# Patient Record
Sex: Female | Born: 1995 | Race: White | Hispanic: No | Marital: Single | Smoking: Never smoker
Health system: Southern US, Community
[De-identification: ages and names within clinical notes are randomized; demographics above are authoritative.]

## PROBLEM LIST (undated history)

## (undated) DIAGNOSIS — Z789 Other specified health status: Secondary | ICD-10-CM

## (undated) HISTORY — DX: Other specified health status: Z78.9

## (undated) HISTORY — PX: WISDOM TOOTH EXTRACTION: SHX21

---

## 2019-02-07 HISTORY — PX: KNEE SURGERY: SHX244

## 2019-06-09 HISTORY — PX: KNEE SURGERY: SHX244

## 2021-07-05 LAB — OB RESULTS CONSOLE HIV ANTIBODY (ROUTINE TESTING): HIV: NONREACTIVE

## 2021-07-05 LAB — OB RESULTS CONSOLE HGB/HCT, BLOOD
HCT: 38 (ref 29–41)
Hemoglobin: 12.5

## 2021-07-05 LAB — OB RESULTS CONSOLE PLATELET COUNT: Platelets: 276

## 2021-07-05 LAB — OB RESULTS CONSOLE RPR: RPR: NONREACTIVE

## 2021-07-05 LAB — OB RESULTS CONSOLE GC/CHLAMYDIA
Chlamydia: NEGATIVE
Gonorrhea: NEGATIVE

## 2021-07-05 LAB — OB RESULTS CONSOLE ABO/RH: RH Type: NEGATIVE

## 2021-07-05 LAB — OB RESULTS CONSOLE ANTIBODY SCREEN: Antibody Screen: NEGATIVE

## 2021-07-05 LAB — OB RESULTS CONSOLE VARICELLA ZOSTER ANTIBODY, IGG: Varicella: IMMUNE

## 2021-07-05 LAB — OB RESULTS CONSOLE RUBELLA ANTIBODY, IGM: Rubella: IMMUNE

## 2021-07-05 LAB — OB RESULTS CONSOLE HEPATITIS B SURFACE ANTIGEN: Hepatitis B Surface Ag: NEGATIVE

## 2021-07-05 LAB — HEPATITIS C ANTIBODY: HCV Ab: NEGATIVE

## 2021-08-09 LAB — CYSTIC FIBROSIS DIAGNOSTIC STUDY: Interpretation-CFDNA:: POSITIVE

## 2021-09-08 NOTE — L&D Delivery Note (Signed)
OB/GYN Faculty Practice Delivery Note  Chloe Chen is a 26 y.o. G1P1001 s/p SVD at [redacted]w[redacted]d. She was admitted for IOL for previous FGR.   ROM: 9h 60m with clear fluid GBS Status: negative Maximum Maternal Temperature: 98.1  Labor Progress: Presented for IOL and received cytotec x2, and then was started on pitocin, SROMed and then progressed to complete  Delivery Date/Time: 1113 on 5/23 Delivery: Called to room and patient was complete and pushing. Head delivered OA. No nuchal cord present. Shoulder and body delivered in usual fashion. Infant with spontaneous cry, placed on mother's abdomen, dried and stimulated. Cord clamped x 2 after 1-minute delay, and cut by father of baby Chloe Chen. Cord blood drawn. Placenta delivered spontaneously with gentle cord traction. Fundus firm with massage and Pitocin. Labia, perineum, vagina, and cervix inspected and found to have bilateral labial lacerations. The right labial laceration was deeper was repaired with 3-0 Vicryl. The left labial laceration was repaired with 4-0 monocryl in running fashion   Placenta: intact, 3V cord, to L&D Complications: none Lacerations: bilateral labial EBL: 350cc Analgesia: epidural  Infant: female  APGARs 9,9  3050g  Chloe Mccreedy, MD, MPH OB Fellow, Faculty Practice Center for [employer], [facility]

## 2021-10-17 ENCOUNTER — Other Ambulatory Visit: Payer: Self-pay

## 2021-10-17 ENCOUNTER — Ambulatory Visit (INDEPENDENT_AMBULATORY_CARE_PROVIDER_SITE_OTHER): Payer: PRIVATE HEALTH INSURANCE

## 2021-10-17 DIAGNOSIS — Z3400 Encounter for supervision of normal first pregnancy, unspecified trimester: Secondary | ICD-10-CM

## 2021-10-17 DIAGNOSIS — O26899 Other specified pregnancy related conditions, unspecified trimester: Secondary | ICD-10-CM | POA: Insufficient documentation

## 2021-10-17 DIAGNOSIS — O0990 Supervision of high risk pregnancy, unspecified, unspecified trimester: Secondary | ICD-10-CM | POA: Insufficient documentation

## 2021-10-17 DIAGNOSIS — Z141 Cystic fibrosis carrier: Secondary | ICD-10-CM | POA: Insufficient documentation

## 2021-10-17 DIAGNOSIS — Z6791 Unspecified blood type, Rh negative: Secondary | ICD-10-CM | POA: Insufficient documentation

## 2021-10-17 MED ORDER — BLOOD PRESSURE KIT DEVI: 1.0000 | Freq: Once | 0 refills | Status: AC

## 2021-10-17 NOTE — Progress Notes (Signed)
New OB Intake  Chloe Chen is here today in person to transfer care from [facility], [place]. I explained I am completing New OB Intake today. We discussed her EDD of 02/06/22 that is based on LMP of 05/02/21. Pt is G1/P0. I reviewed her allergies, medications, Medical/Surgical/OB history, and appropriate screenings. Based on history, this is a low risk pregnancy.  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   MyChart/Babyscripts MyChart access verified. Babyscripts instructions given and order placed.  Blood Pressure Cuff  Blood pressure cuff ordered for patient to pick-up from [employer]. Explained after first prenatal appt pt will check weekly and document in Babyscripts.  Anatomy US Explained first scheduled [place] will be around 19 weeks. Anatomy US scheduled for 11/07/21 at 0800.  Labs Labs completed at former OB/GYN. Last PAP completed 2020; this was normal per patient, but no record. Will need PAP postpartum. Carrier screen positive for Cystic Fibrosis. Partner declined testing prior, requested to wait until care was transferred. [employer] test arranged for partner.  COVID Vaccine Patient has had COVID vaccine.   CenteringPregnancy Candidate?  No- GA [redacted] weeks  Mom + Baby Combined Care Candidate?    Yes- patient enrolled. Stephanie RN to bedside to schedule new OB appts.  Social Determinants of Health Food Insecurity: Patient denies food insecurity. Transportation: Patient denies transportation needs. Childcare: Discussed no children allowed at ultrasound appointments. Offered childcare services; patient declines childcare services at this time.  First visit review I reviewed new OB appt with pt. I explained she will have a provider visit that  will include physical exam and possible labs. Explained pt will be seen by Mom+Baby provider at first visit; encounter routed to appropriate provider. Explained that patient will be seen by pregnancy navigator following visit with provider.    Marjo Bicker, RN 10/17/2021  4:52 PM

## 2021-10-22 NOTE — Progress Notes (Signed)
Attestation of Attending Supervision of clinical support staff: I agree with the care provided to this patient and was available for any consultation.  I have reviewed the RN's note and chart. I was available for consult and to see the patient if needed.   Isamar Nazir MD MPH Attending Physician Faculty Practice- [facility]  

## 2021-10-29 ENCOUNTER — Telehealth: Payer: Self-pay | Admitting: Family Medicine

## 2021-10-29 NOTE — Telephone Encounter (Signed)
Call placed back to pt. Spoke with pt. Pt needing to reschedule new OB appt due to work schedule. Pt rescheduled for 3/3 at 935am. Pt verbalized understanding and agreeable to new date and time of appt.  Laney Pastor

## 2021-10-29 NOTE — Telephone Encounter (Signed)
This patient is needing to change for appt for [facility] on 11/05/21 at 3:15pm.

## 2021-11-05 ENCOUNTER — Encounter: Payer: PRIVATE HEALTH INSURANCE | Admitting: Nurse Practitioner

## 2021-11-07 ENCOUNTER — Encounter: Payer: Self-pay | Admitting: *Deleted

## 2021-11-07 ENCOUNTER — Other Ambulatory Visit: Payer: Self-pay

## 2021-11-07 ENCOUNTER — Other Ambulatory Visit: Payer: Self-pay | Admitting: *Deleted

## 2021-11-07 ENCOUNTER — Ambulatory Visit: Payer: Medicaid Other | Attending: Family Medicine

## 2021-11-07 ENCOUNTER — Ambulatory Visit: Payer: Medicaid Other | Admitting: *Deleted

## 2021-11-07 VITALS — BP 112/69 | HR 64

## 2021-11-07 DIAGNOSIS — Z6791 Unspecified blood type, Rh negative: Secondary | ICD-10-CM | POA: Insufficient documentation

## 2021-11-07 DIAGNOSIS — Z363 Encounter for antenatal screening for malformations: Secondary | ICD-10-CM | POA: Insufficient documentation

## 2021-11-07 DIAGNOSIS — Z3400 Encounter for supervision of normal first pregnancy, unspecified trimester: Secondary | ICD-10-CM

## 2021-11-07 DIAGNOSIS — Z148 Genetic carrier of other disease: Secondary | ICD-10-CM | POA: Diagnosis not present

## 2021-11-07 DIAGNOSIS — Z362 Encounter for other antenatal screening follow-up: Secondary | ICD-10-CM

## 2021-11-07 DIAGNOSIS — Z3A27 27 weeks gestation of pregnancy: Secondary | ICD-10-CM | POA: Insufficient documentation

## 2021-11-07 DIAGNOSIS — O09892 Supervision of other high risk pregnancies, second trimester: Secondary | ICD-10-CM

## 2021-11-07 DIAGNOSIS — O26899 Other specified pregnancy related conditions, unspecified trimester: Secondary | ICD-10-CM

## 2021-11-07 DIAGNOSIS — Z141 Cystic fibrosis carrier: Secondary | ICD-10-CM | POA: Diagnosis not present

## 2021-11-08 ENCOUNTER — Other Ambulatory Visit: Payer: Self-pay | Admitting: General Practice

## 2021-11-08 ENCOUNTER — Ambulatory Visit (INDEPENDENT_AMBULATORY_CARE_PROVIDER_SITE_OTHER): Payer: Medicaid Other | Admitting: Family Medicine

## 2021-11-08 ENCOUNTER — Other Ambulatory Visit: Payer: PRIVATE HEALTH INSURANCE

## 2021-11-08 VITALS — BP 122/63 | HR 70 | Wt 168.6 lb

## 2021-11-08 DIAGNOSIS — Z6791 Unspecified blood type, Rh negative: Secondary | ICD-10-CM

## 2021-11-08 DIAGNOSIS — Z23 Encounter for immunization: Secondary | ICD-10-CM | POA: Diagnosis not present

## 2021-11-08 DIAGNOSIS — Z3403 Encounter for supervision of normal first pregnancy, third trimester: Secondary | ICD-10-CM

## 2021-11-08 DIAGNOSIS — O26899 Other specified pregnancy related conditions, unspecified trimester: Secondary | ICD-10-CM

## 2021-11-08 DIAGNOSIS — Z3042 Encounter for surveillance of injectable contraceptive: Secondary | ICD-10-CM

## 2021-11-08 MED ORDER — RHO D IMMUNE GLOBULIN 1500 UNIT/2ML IJ SOSY
300.0000 ug | PREFILLED_SYRINGE | Freq: Once | INTRAMUSCULAR | Status: AC
  Administered 2021-11-08: 300 ug via INTRAMUSCULAR

## 2021-11-08 NOTE — Progress Notes (Signed)
?  ? ?Subjective:  ? ?Chloe Chen is a 26 y.o. G1P0000 at [redacted]w[redacted]d by LMP, first trimester ultrasound being seen today for her first obstetrical visit.  Her obstetrical history is significant for  rh neg . Patient does intend to breast feed. Pregnancy history fully reviewed. ? ?Patient reports no complaints. ? ?HISTORY: ?OB History  ?Gravida Para Term Preterm AB Living  ?1 0 0 0 0 0  ?SAB IAB Ectopic Multiple Live Births  ?0 0 0 0 0  ?  ?# Outcome Date GA Lbr Len/2nd Weight Sex Delivery Anes PTL Lv  ?1 Current           ?  ? ?Last pap smear: ?No results found for: DIAGPAP, HPV, HPVHIGH ?*need records* ? ?Past Medical History:  ?Diagnosis Date  ? Medical history non-contributory   ? ?Past Surgical History:  ?Procedure Laterality Date  ? KNEE SURGERY  02/2019  ? KNEE SURGERY  06/2019  ? WISDOM TOOTH EXTRACTION    ? ?Family History  ?Problem Relation Age of Onset  ? Diabetes Neg Hx   ? Hypertension Neg Hx   ? Cancer Neg Hx   ? ?Social History  ? ?Tobacco Use  ? Smoking status: Never  ? Smokeless tobacco: Never  ?Vaping Use  ? Vaping Use: Never used  ?Substance Use Topics  ? Alcohol use: Never  ? Drug use: Never  ? ?No Known Allergies ?Current Outpatient Medications on File Prior to Visit  ?Medication Sig Dispense Refill  ? doxylamine, Sleep, (UNISOM) 25 MG tablet Take 25 mg by mouth at bedtime as needed.    ? Prenatal Vit-Fe Fumarate-FA (MULTIVITAMIN-PRENATAL) 27-0.8 MG TABS tablet Take 1 tablet by mouth daily at 12 noon.    ? Pyridoxine HCl (B-6 PO) Take by mouth.    ? ?No current facility-administered medications on file prior to visit.  ? ? ? ?Exam  ? ?Vitals:  ? 11/08/21 0953  ?BP: 122/63  ?Pulse: 70  ?Weight: 168 lb 9.6 oz (76.5 kg)  ? ?Fetal Heart Rate (bpm): 145 ? ?System: General: well-developed, well-nourished female in no acute distress  ? Skin: normal coloration and turgor, no rashes  ? Neurologic: oriented, normal, negative, normal mood  ? Extremities: normal strength, tone, and muscle mass, ROM of all  joints is normal  ? HEENT PERRLA, extraocular movement intact and sclera clear, anicteric  ? Neck supple and no masses  ? Respiratory:  no respiratory distress  ? ? ?  ?Assessment:  ? ?Pregnancy: G1P0000 ?Patient Active Problem List  ? Diagnosis Date Noted  ? Supervision of low-risk first pregnancy 10/17/2021  ? Rh negative state in antepartum period 10/17/2021  ? 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 ?  ?Plan:  ?1. Rh negative state in antepartum period ?Rhogam given today ? ?2. Encounter for supervision of low-risk first pregnancy in third trimester ?Initial labs drawn. ?Needs pp pap ?Continue prenatal vitamins. ?Genetic Screening discussed, NIPS: results reviewed ?Ultrasound discussed; fetal anatomic survey: results reviewed. 12%, reassured patient, will see what follow up growth US shows.  ?Problem list reviewed and updated. ?The nature of [facility] clinic was explained to patient; Voiced they may need to be seen by other [facility] providers which includes family medicine physicians, OB GYNs, and APPs. Delivery will hopefully be with one of the [facility] providers or another [facility] Medicine physician and we cannot promise this at this time.  Discussed there are [facility] staff in the hospital 24-7 and they understand and support this model and there  is a likelihood one of these providers will catch their baby.  We also discussed that the service includes learners (residents, student) and they will be involved in the care team.  ?- [facility] AMB BABYSCRIPTS SCHEDULE OPTIMIZATION ? ?3. Cystic fibrosis carrier ?Partner testing in process ? ?Routine obstetric precautions reviewed. ?Return in 2 weeks (on 11/22/2021) for [facility] patient, ob visit. ? ?  ? ?

## 2021-11-08 NOTE — Patient Instructions (Signed)
Third Trimester of Pregnancy The third trimester of pregnancy is from week 28 through week 32. This is months 7 through 9. The third trimester is a time when the unborn baby (fetus) is growing rapidly. At the end of the ninth month, the fetus is about 20 inches long and weighs 6-10 pounds. Body changes during your third trimester During the third trimester, your body will continue to go through many changes. The changes vary and generally return to normal after your baby is born. Physical changes Your weight will continue to increase. You can expect to gain 25-35 pounds (11-16 kg) by the end of the pregnancy if you begin pregnancy at a normal weight. If you are underweight, you can expect to gain 28-40 lb (about 13-18 kg), and if you are overweight, you can expect to gain 15-25 lb (about 7-11 kg). You may begin to get stretch marks on your hips, abdomen, and breasts. Your breasts will continue to grow and may hurt. A yellow fluid (colostrum) may leak from your breasts. This is the first milk you are producing for your baby. You may have changes in your hair. These can include thickening of your hair, rapid growth, and changes in texture. Some people also have hair loss during or after pregnancy, or hair that feels dry or thin. Your belly button may stick out. You may notice more swelling in your hands, face, or ankles. Health changes You may have heartburn. You may have constipation. You may develop hemorrhoids. You may develop swollen, bulging veins (varicose veins) in your legs. You may have increased body aches in the pelvis, back, or thighs. This is due to weight gain and increased hormones that are relaxing your joints. You may have increased tingling or numbness in your hands, arms, and legs. The skin on your abdomen may also feel numb. You may feel short of breath because of your expanding uterus. Other changes You may urinate more often because the fetus is moving lower into your pelvis  and pressing on your bladder. You may have more problems sleeping. This may be caused by the size of your abdomen, an increased need to urinate, and an increase in your body's metabolism. You may notice the fetus "dropping," or moving lower in your abdomen (lightening). You may have increased vaginal discharge. You may notice that you have pain around your pelvic bone as your uterus distends. Follow these instructions at home: Medicines Follow your health care provider's instructions regarding medicine use. Specific medicines may be either safe or unsafe to take during pregnancy. Do not take any medicines unless approved by your health care provider. Take a prenatal vitamin that contains at least 600 micrograms (mcg) of folic acid. Eating and drinking Eat a healthy diet that includes fresh fruits and vegetables, whole grains, good sources of protein such as meat, eggs, or tofu, and low-fat dairy products. Avoid raw meat and unpasteurized juice, milk, and cheese. These carry germs that can harm you and your baby. Eat 4 or 5 small meals rather than 3 large meals a day. You may need to take these actions to prevent or treat constipation: Drink enough fluid to keep your urine pale yellow. Eat foods that are high in fiber, such as beans, whole grains, and fresh fruits and vegetables. Limit foods that are high in fat and processed sugars, such as fried or sweet foods. Activity Exercise only as directed by your health care provider. Most people can continue their usual exercise routine during pregnancy. Try to  exercise for 30 minutes at least 5 days a week. Stop exercising if you experience contractions in the uterus. °Stop exercising if you develop pain or cramping in the lower abdomen or lower back. °Avoid heavy lifting. °Do not exercise if it is very hot or humid or if you are at a high altitude. °If you choose to, you may continue to have sex unless your health care provider tells you not  to. °Relieving pain and discomfort °Take frequent breaks and rest with your legs raised (elevated) if you have leg cramps or low back pain. °Take warm sitz baths to soothe any pain or discomfort caused by hemorrhoids. Use hemorrhoid cream if your health care provider approves. °Wear a supportive bra to prevent discomfort from breast tenderness. °If you develop varicose veins: °Wear support hose as told by your health care provider. °Elevate your feet for 15 minutes, 3-4 times a day. °Limit salt in your diet. °Safety °Talk to your health care provider before traveling far distances. °Do not use hot tubs, steam rooms, or saunas. °Wear your seat belt at all times when driving or riding in a car. °Talk with your health care provider if someone is verbally or physically abusive to you. °Preparing for birth °To prepare for the arrival of your baby: °Take prenatal classes to understand, practice, and ask questions about labor and delivery. °Visit the hospital and tour the maternity area. °Purchase a rear-facing car seat and make sure you know how to install it in your car. °Prepare the baby's room or sleeping area. Make sure to remove all pillows and stuffed animals from the baby's crib to prevent suffocation. °General instructions °Avoid cat litter boxes and soil used by cats. These carry germs that can cause birth defects in the baby. If you have a cat, ask someone to clean the litter box for you. °Do not douche or use tampons. Do not use scented sanitary pads. °Do not use any products that contain nicotine or tobacco, such as cigarettes, e-cigarettes, and chewing tobacco. If you need help quitting, ask your health care provider. °Do not use any herbal remedies, illegal drugs, or medicines that were not prescribed to you. Chemicals in these products can harm your baby. °Do not drink alcohol. °You will have more frequent prenatal exams during the third trimester. During a routine prenatal visit, your health care provider  will do a physical exam, perform tests, and discuss your overall health. Keep all follow-up visits. This is important. °Where to find more information °American Pregnancy Association: americanpregnancy.org °American College of Obstetricians and Gynecologists: acog.org/en/Womens%20Health/Pregnancy °Office on Women's Health: womenshealth.gov/pregnancy °Contact a health care provider if you have: °A fever. °Mild pelvic cramps, pelvic pressure, or nagging pain in your abdominal area or lower back. °Vomiting or diarrhea. °Bad-smelling vaginal discharge or foul-smelling urine. °Pain when you urinate. °A headache that does not go away when you take medicine. °Visual changes or see spots in front of your eyes. °Get help right away if: °Your water breaks. °You have regular contractions less than 5 minutes apart. °You have spotting or bleeding from your vagina. °You have severe abdominal pain. °You have difficulty breathing. °You have chest pain. °You have fainting spells. °You have not felt your baby move for the time period told by your health care provider. °You have new or increased pain, swelling, or redness in an arm or leg. °Summary °The third trimester of pregnancy is from week 28 through week 40 (months 7 through 9). °You may have more problems sleeping.   This can be caused by the size of your abdomen, an increased need to urinate, and an increase in your body's metabolism. You will have more frequent prenatal exams during the third trimester. Keep all follow-up visits. This is important. This information is not intended to replace advice given to you by your health care provider. Make sure you discuss any questions you have with your health care provider. Document Revised: 02/01/2020 Document Reviewed: 12/08/2019 Elsevier Patient Education  2022 Else[employer] Inc.  Contraception Choices Contraception, also called birth control, refers to methods or devices that prevent pregnancy. Hormonal methods Contraceptive  implant A contraceptive implant is a thin, plastic tube that contains a hormone that prevents pregnancy. It is different from an intrauterine device (IUD). It is inserted into the upper part of the arm by a health care provider. Implants can be effective for up to 3 years. Progestin-only injections Progestin-only injections are injections of progestin, a synthetic form of the hormone progesterone. They are given every 3 months by a health care provider. Birth control pills Birth control pills are pills that contain hormones that prevent pregnancy. They must be taken once a day, preferably at the same time each day. A prescription is needed to use this method of contraception. Birth control patch The birth control patch contains hormones that prevent pregnancy. It is placed on the skin and must be changed once a week for three weeks and removed on the fourth week. A prescription is needed to use this method of contraception. Vaginal ring A vaginal ring contains hormones that prevent pregnancy. It is placed in the vagina for three weeks and removed on the fourth week. After that, the process is repeated with a new ring. A prescription is needed to use this method of contraception. Emergency contraceptive Emergency contraceptives prevent pregnancy after unprotected sex. They come in pill form and can be taken up to 5 days after sex. They work best the sooner they are taken after having sex. Most emergency contraceptives are available without a prescription. This method should not be used as your only form of birth control. Barrier methods Female condom A female condom is a thin sheath that is worn over the penis during sex. Condoms keep sperm from going inside a woman's body. They can be used with a sperm-killing substance (spermicide) to increase their effectiveness. They should be thrown away after one use. Female condom A female condom is a soft, loose-fitting sheath that is put into the vagina before  sex. The condom keeps sperm from going inside a woman's body. They should be thrown away after one use. Diaphragm A diaphragm is a soft, dome-shaped barrier. It is inserted into the vagina before sex, along with a spermicide. The diaphragm blocks sperm from entering the uterus, and the spermicide kills sperm. A diaphragm should be left in the vagina for 6-8 hours after sex and removed within 24 hours. A diaphragm is prescribed and fitted by a health care provider. A diaphragm should be replaced every 1-2 years, after giving birth, after gaining more than 15 lb (6.8 kg), and after pelvic surgery. Cervical cap A cervical cap is a round, soft latex or plastic cup that fits over the cervix. It is inserted into the vagina before sex, along with spermicide. It blocks sperm from entering the uterus. The cap should be left in place for 6-8 hours after sex and removed within 48 hours. A cervical cap must be prescribed and fitted by a health care provider. It should be replaced   every 2 years. Sponge A sponge is a soft, circular piece of polyurethane foam with spermicide in it. The sponge helps block sperm from entering the uterus, and the spermicide kills sperm. To use it, you make it wet and then insert it into the vagina. It should be inserted before sex, left in for at least 6 hours after sex, and removed and thrown away within 30 hours. Spermicides Spermicides are chemicals that kill or block sperm from entering the cervix and uterus. They can come as a cream, jelly, suppository, foam, or tablet. A spermicide should be inserted into the vagina with an applicator at least 10-15 minutes before sex to allow time for it to work. The process must be repeated every time you have sex. Spermicides do not require a prescription. Intrauterine contraception Intrauterine device (IUD) An IUD is a T-shaped device that is put in a woman's uterus. There are two types: Hormone IUD.This type contains progestin, a synthetic  form of the hormone progesterone. This type can stay in place for 3-5 years. Copper IUD.This type is wrapped in copper wire. It can stay in place for 10 years. Permanent methods of contraception Female tubal ligation In this method, a woman's fallopian tubes are sealed, tied, or blocked during surgery to prevent eggs from traveling to the uterus. Hysteroscopic sterilization In this method, a small, flexible insert is placed into each fallopian tube. The inserts cause scar tissue to form in the fallopian tubes and block them, so sperm cannot reach an egg. The procedure takes about 3 months to be effective. Another form of birth control must be used during those 3 months. Female sterilization This is a procedure to tie off the tubes that carry sperm (vasectomy). After the procedure, the man can still ejaculate fluid (semen). Another form of birth control must be used for 3 months after the procedure. Natural planning methods Natural family planning In this method, a couple does not have sex on days when the woman could become pregnant. Calendar method In this method, the woman keeps track of the length of each menstrual cycle, identifies the days when pregnancy can happen, and does not have sex on those days. Ovulation method In this method, a couple avoids sex during ovulation. Symptothermal method This method involves not having sex during ovulation. The woman typically checks for ovulation by watching changes in her temperature and in the consistency of cervical mucus. Post-ovulation method In this method, a couple waits to have sex until after ovulation. Where to find more information Centers for Disease Control and Prevention: [employer].com.br Summary Contraception, also called birth control, refers to methods or devices that prevent pregnancy. Hormonal methods of contraception include implants, injections, pills, patches, vaginal rings, and emergency contraceptives. Barrier methods of  contraception can include female condoms, female condoms, diaphragms, cervical caps, sponges, and spermicides. There are two types of IUDs (intrauterine devices). An IUD can be put in a woman's uterus to prevent pregnancy for 3-5 years. Permanent sterilization can be done through a procedure for males and females. Natural family planning methods involve nothaving sex on days when the woman could become pregnant. This information is not intended to replace advice given to you by your health care provider. Make sure you discuss any questions you have with your health care provider. Document Revised: 01/30/2020 Document Reviewed: 01/30/2020 Elsevier Patient Education  2022 [employer].

## 2021-11-09 LAB — GLUCOSE TOLERANCE, 2 HOURS W/ 1HR
Glucose, 1 hour: 103 mg/dL (ref 70–179)
Glucose, 2 hour: 66 mg/dL — ABNORMAL LOW (ref 70–152)
Glucose, Fasting: 66 mg/dL — ABNORMAL LOW (ref 70–91)

## 2021-11-14 ENCOUNTER — Other Ambulatory Visit: Payer: PRIVATE HEALTH INSURANCE

## 2021-11-22 ENCOUNTER — Telehealth: Payer: Self-pay | Admitting: Genetics

## 2021-11-22 NOTE — Telephone Encounter (Signed)
Called Chloe Chen to return her partner [name]) CF carrier screening results. Left voicemail with [facility] call back number. ?

## 2021-11-25 ENCOUNTER — Other Ambulatory Visit: Payer: Self-pay

## 2021-11-28 ENCOUNTER — Ambulatory Visit (INDEPENDENT_AMBULATORY_CARE_PROVIDER_SITE_OTHER): Payer: Medicaid Other | Admitting: Family Medicine

## 2021-11-28 ENCOUNTER — Other Ambulatory Visit: Payer: Self-pay

## 2021-11-28 ENCOUNTER — Encounter: Payer: Self-pay | Admitting: Family Medicine

## 2021-11-28 VITALS — BP 113/76 | HR 82 | Wt 177.7 lb

## 2021-11-28 DIAGNOSIS — O26899 Other specified pregnancy related conditions, unspecified trimester: Secondary | ICD-10-CM

## 2021-11-28 DIAGNOSIS — Z3403 Encounter for supervision of normal first pregnancy, third trimester: Secondary | ICD-10-CM

## 2021-11-28 DIAGNOSIS — Z6791 Unspecified blood type, Rh negative: Secondary | ICD-10-CM

## 2021-11-28 NOTE — Patient Instructions (Signed)
[facility] has many resources to help you plan for labor and support your pregnancy and parenting.  ? ?You are able to sign up for these courses through the website. Courses are offered evenings and weekends to increase your ability to attend. They are designed to low or no cost.  ? ?Here is the website: ?[email or web address]  ? ? ?

## 2021-11-28 NOTE — Progress Notes (Signed)
? ? ?  PRENATAL VISIT NOTE ? ?Subjective:  ?Chloe Chen is a 26 y.o. G1P0000 at [redacted]w[redacted]d being seen today for ongoing prenatal care.  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 ? ?Patient reports no complaints.  Contractions: Not present. Vag. Bleeding: None.  Movement: Present. Denies leaking of fluid.  ? ?The following portions of the patient's history were reviewed and updated as appropriate: allergies, current medications, past family history, past medical history, past social history, past surgical history and problem list.  ? ?Objective:  ? ?Vitals:  ? 11/28/21 1119  ?BP: 113/76  ?Pulse: 82  ?Weight: 177 lb 11.2 oz (80.6 kg)  ? ? ?Fetal Status: Fetal Heart Rate (bpm): 148   Movement: Present    ? ?General:  Alert, oriented and cooperative. Patient is in no acute distress.  ?Skin: Skin is warm and dry. No rash noted.   ?Cardiovascular: Normal heart rate noted  ?Respiratory: Normal respiratory effort, no problems with respiration noted  ?Abdomen: Soft, gravid, appropriate for gestational age.  Pain/Pressure: Absent     ?Pelvic: Cervical exam deferred        ?Extremities: Normal range of motion.  Edema: None  ?Mental Status: Normal mood and affect. Normal behavior. Normal judgment and thought content.  ? ?Assessment and Plan:  ?Pregnancy: G1P0000 at [redacted]w[redacted]d ? ?1. Encounter for supervision of low-risk first pregnancy in third trimester ?Up to date ?Wants to try magnesium- ok to try ?Reviewed childbirth education resources through [facility] ?Recommended signing up for a class ?Discussed birth plans/ideas and coping with labor.  ? ?2. Rh negative state in antepartum period ?Rhogam on 3/3 ? ?Preterm labor symptoms and general obstetric precautions including but not limited to vaginal bleeding, contractions, leaking of fluid and fetal movement  were reviewed in detail with the patient. ?Please refer to After Visit Summary for other counseling recommendations.  ? ?Return in about 2 weeks (around 12/12/2021) for Routine prenatal care, Mom+Baby Combined Care. ? ?Future Appointments  ?Date Time Provider Department Center  ?12/05/2021  8:45 AM [facility] NURSE [facility]  ?12/05/2021  9:00 AM [facility] US1 [facility]  ?12/11/2021  3:55 PM Bernerd Limbo, CNM [facility]  ?12/25/2021  3:55 PM Crissie Reese Mary Sella, MD [facility]  ?12/27/2021  9:15 AM Venora Maples, MD [facility]  ?01/07/2022  3:35 PM Crissie Reese, Mary Sella, MD [facility]  ?01/16/2022  2:55 PM Federico Flake, MD [facility]  ?01/21/2022  2:15 PM Venora Maples, MD [facility]  ?01/29/2022  1:35 PM Venora Maples, MD [facility]  ? ? ?Federico Flake, MD ?

## 2021-12-05 ENCOUNTER — Ambulatory Visit: Payer: Medicaid Other | Admitting: *Deleted

## 2021-12-05 ENCOUNTER — Ambulatory Visit: Payer: Medicaid Other | Attending: Maternal & Fetal Medicine

## 2021-12-05 ENCOUNTER — Encounter: Payer: Self-pay | Admitting: *Deleted

## 2021-12-05 ENCOUNTER — Other Ambulatory Visit: Payer: Self-pay | Admitting: *Deleted

## 2021-12-05 VITALS — BP 120/68 | HR 65

## 2021-12-05 DIAGNOSIS — O09892 Supervision of other high risk pregnancies, second trimester: Secondary | ICD-10-CM | POA: Insufficient documentation

## 2021-12-05 DIAGNOSIS — Z3A31 31 weeks gestation of pregnancy: Secondary | ICD-10-CM

## 2021-12-05 DIAGNOSIS — Z362 Encounter for other antenatal screening follow-up: Secondary | ICD-10-CM

## 2021-12-05 DIAGNOSIS — O26899 Other specified pregnancy related conditions, unspecified trimester: Secondary | ICD-10-CM | POA: Diagnosis present

## 2021-12-05 DIAGNOSIS — O365990 Maternal care for other known or suspected poor fetal growth, unspecified trimester, not applicable or unspecified: Secondary | ICD-10-CM

## 2021-12-05 DIAGNOSIS — O285 Abnormal chromosomal and genetic finding on antenatal screening of mother: Secondary | ICD-10-CM | POA: Diagnosis not present

## 2021-12-05 DIAGNOSIS — Z141 Cystic fibrosis carrier: Secondary | ICD-10-CM | POA: Diagnosis not present

## 2021-12-05 DIAGNOSIS — Z6791 Unspecified blood type, Rh negative: Secondary | ICD-10-CM | POA: Insufficient documentation

## 2021-12-11 ENCOUNTER — Telehealth (INDEPENDENT_AMBULATORY_CARE_PROVIDER_SITE_OTHER): Payer: PRIVATE HEALTH INSURANCE | Admitting: Certified Nurse Midwife

## 2021-12-11 VITALS — BP 108/79

## 2021-12-11 DIAGNOSIS — Z6791 Unspecified blood type, Rh negative: Secondary | ICD-10-CM

## 2021-12-11 DIAGNOSIS — Z3403 Encounter for supervision of normal first pregnancy, third trimester: Secondary | ICD-10-CM

## 2021-12-11 DIAGNOSIS — O26899 Other specified pregnancy related conditions, unspecified trimester: Secondary | ICD-10-CM

## 2021-12-11 DIAGNOSIS — Z3A31 31 weeks gestation of pregnancy: Secondary | ICD-10-CM

## 2021-12-12 NOTE — Progress Notes (Signed)
Pt was on, having connection trouble with RN and then disconnected and could not be reached for her visit. ?

## 2021-12-25 ENCOUNTER — Encounter: Payer: PRIVATE HEALTH INSURANCE | Admitting: Family Medicine

## 2021-12-27 ENCOUNTER — Other Ambulatory Visit: Payer: Self-pay | Admitting: Obstetrics and Gynecology

## 2021-12-27 ENCOUNTER — Ambulatory Visit: Payer: Medicaid Other | Attending: Obstetrics and Gynecology

## 2021-12-27 ENCOUNTER — Encounter: Payer: Self-pay | Admitting: Family Medicine

## 2021-12-27 ENCOUNTER — Ambulatory Visit: Payer: Medicaid Other | Admitting: *Deleted

## 2021-12-27 ENCOUNTER — Ambulatory Visit (INDEPENDENT_AMBULATORY_CARE_PROVIDER_SITE_OTHER): Payer: Medicaid Other | Admitting: Family Medicine

## 2021-12-27 VITALS — BP 104/66 | HR 91

## 2021-12-27 VITALS — BP 108/72 | HR 76 | Wt 182.3 lb

## 2021-12-27 DIAGNOSIS — O285 Abnormal chromosomal and genetic finding on antenatal screening of mother: Secondary | ICD-10-CM

## 2021-12-27 DIAGNOSIS — Z141 Cystic fibrosis carrier: Secondary | ICD-10-CM

## 2021-12-27 DIAGNOSIS — O26899 Other specified pregnancy related conditions, unspecified trimester: Secondary | ICD-10-CM | POA: Diagnosis present

## 2021-12-27 DIAGNOSIS — O365990 Maternal care for other known or suspected poor fetal growth, unspecified trimester, not applicable or unspecified: Secondary | ICD-10-CM

## 2021-12-27 DIAGNOSIS — Z6791 Unspecified blood type, Rh negative: Secondary | ICD-10-CM | POA: Diagnosis present

## 2021-12-27 DIAGNOSIS — O365930 Maternal care for other known or suspected poor fetal growth, third trimester, not applicable or unspecified: Secondary | ICD-10-CM | POA: Diagnosis not present

## 2021-12-27 DIAGNOSIS — Z3403 Encounter for supervision of normal first pregnancy, third trimester: Secondary | ICD-10-CM

## 2021-12-27 DIAGNOSIS — Z3A34 34 weeks gestation of pregnancy: Secondary | ICD-10-CM | POA: Diagnosis not present

## 2021-12-27 NOTE — Patient Instructions (Signed)

## 2021-12-27 NOTE — Progress Notes (Signed)
? ?  Subjective:  ?Chloe Chen is a 26 y.o. G1P0000 at [redacted]w[redacted]d being seen today for ongoing prenatal care.  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 ? ?Patient reports no complaints.  Contractions: Not present. Vag. Bleeding: None.  Movement: Present. Denies leaking of fluid.  ? ?The following portions of the patient's history were reviewed and updated as appropriate: allergies, current medications, past family history, past medical history, past social history, past surgical history and problem list. Problem list updated. ? ?Objective:  ? ?Vitals:  ? 12/27/21 0928  ?BP: 108/72  ?Pulse: 76  ?Weight: 182 lb 4.8 oz (82.7 kg)  ? ? ?Fetal Status: Fetal Heart Rate (bpm): 141   Movement: Present    ? ?General:  Alert, oriented and cooperative. Patient is in no acute distress.  ?Skin: Skin is warm and dry. No rash noted.   ?Cardiovascular: Normal heart rate noted  ?Respiratory: Normal respiratory effort, no problems with respiration noted  ?Abdomen: Soft, gravid, appropriate for gestational age. Pain/Pressure: Absent     ?Pelvic: Vag. Bleeding: None     ?Cervical exam deferred        ?Extremities: Normal range of motion.  Edema: None  ?Mental Status: Normal mood and affect. Normal behavior. Normal judgment and thought content.  ? ?Urinalysis:     ? ?Assessment and Plan:  ?Pregnancy: G1P0000 at [redacted]w[redacted]d ? ?1. Rh negative state in antepartum period ?S/p rhogam 11/08/21 ? ?2. Cystic fibrosis carrier ?Partner testing neg ? ?3. Encounter for supervision of low-risk first pregnancy in third trimester ?BP and FHR normal ?EFW 11% last growth scan, has repeat scheduled for this afternoon ?Leaning towards post placental IUD for contraception ? ?Preterm labor symptoms and general obstetric precautions including but not limited to vaginal bleeding, contractions,  leaking of fluid and fetal movement were reviewed in detail with the patient. ?Please refer to After Visit Summary for other counseling recommendations.  ?Return in 2 weeks (on 01/10/2022) for Dyad patient, ob visit. ? ? ?Venora Maples, MD ? ?

## 2021-12-30 ENCOUNTER — Other Ambulatory Visit: Payer: Self-pay

## 2021-12-30 ENCOUNTER — Inpatient Hospital Stay (HOSPITAL_COMMUNITY)
Admission: AD | Admit: 2021-12-30 | Discharge: 2021-12-30 | Disposition: A | Discharge status: Home / Self Care | Payer: Medicaid Other | Attending: Obstetrics & Gynecology | Admitting: Obstetrics & Gynecology

## 2021-12-30 ENCOUNTER — Encounter (HOSPITAL_COMMUNITY): Payer: Self-pay | Admitting: Obstetrics & Gynecology

## 2021-12-30 DIAGNOSIS — O368130 Decreased fetal movements, third trimester, not applicable or unspecified: Secondary | ICD-10-CM | POA: Diagnosis not present

## 2021-12-30 DIAGNOSIS — Z3689 Encounter for other specified antenatal screening: Secondary | ICD-10-CM

## 2021-12-30 DIAGNOSIS — Z3A34 34 weeks gestation of pregnancy: Secondary | ICD-10-CM | POA: Diagnosis not present

## 2021-12-30 NOTE — MAU Note (Signed)
..  Chloe Chen is a 26 y.o. at [redacted]w[redacted]d here in MAU reporting: Last fetal movement was Sunday morning. Denies vaginal bleeding or abdominal pain.  ? ?Pain score: 0/10 ? ?FHT: 130s ?Lab orders placed from triage: none ? ?

## 2021-12-30 NOTE — MAU Provider Note (Signed)
History  ? ?[id number] ? ? ?Chief Complaint  ?Patient presents with  ? Decreased Fetal Movement  ? ? ?HPI ?Chloe Chen is a 26 y.o. female  G1P0000 here with report of decreased fetal movement since Sunday morning.  Reports feeling the baby move less that 5 times in the past 24 hour.  Denies vaginal bleeding or leaking of fluid.  Feels occasional contraction. ? ?Patient's last menstrual period was 05/02/2021. ? ?OB History  ?Gravida Para Term Preterm AB Living  ?1 0 0 0 0 0  ?SAB IAB Ectopic Multiple Live Births  ?0 0 0 0 0  ?  ?# Outcome Date GA Lbr Len/2nd Weight Sex Delivery Anes PTL Lv  ?1 Current           ? ? ?Past Medical History:  ?Diagnosis Date  ? Medical history non-contributory   ? ? ?Family History  ?Problem Relation Age of Onset  ? Diabetes Neg Hx   ? Hypertension Neg Hx   ? Cancer Neg Hx   ? ? ?Social History  ? ?Socioeconomic History  ? Marital status: Married  ?  Spouse name: Not on file  ? Number of children: Not on file  ? Years of education: Not on file  ? Highest education level: Not on file  ?Occupational History  ? Occupation: Chef  ?  Employer: [employer]  ?Tobacco Use  ? Smoking status: Never  ? Smokeless tobacco: Never  ?Vaping Use  ? Vaping Use: Never used  ?Substance and Sexual Activity  ? Alcohol use: Never  ? Drug use: Never  ? Sexual activity: Yes  ?  Birth control/protection: None  ?Other Topics Concern  ? Not on file  ?Social History Narrative  ? Not on file  ? ?Social Determinants of Health  ? ?Financial Resource Strain: Not on file  ?Food Insecurity: No Food Insecurity  ? Worried About Programme researcher, broadcasting/film/video in the Last Year: Never true  ? Ran Out of Food in the Last Year: Never true  ?Transportation Needs: No Transportation Needs  ? Lack of Transportation (Medical): No  ? Lack of Transportation (Non-Medical): No  ?Physical Activity: Not on file  ?Stress: Not on file  ?Social Connections: Not on file  ? ? ?No Known Allergies ? ?No current facility-administered medications on file  prior to encounter.  ? ?Current Outpatient Medications on File Prior to Encounter  ?Medication Sig Dispense Refill  ? Prenatal Vit-Fe Fumarate-FA (MULTIVITAMIN-PRENATAL) 27-0.8 MG TABS tablet Take 1 tablet by mouth daily at 12 noon.    ? ? ? ?Review of Systems  ?Constitutional: Negative.   ?Gastrointestinal: Negative.   ?Genitourinary: Negative.   ? ? ?Physical Exam  ? ?Vitals:  ? 12/30/21 0414  ?BP: 118/70  ?Pulse: 83  ?Resp: 16  ?Temp: 98.3 ?F (36.8 ?C)  ?TempSrc: Oral  ?SpO2: 99%  ?Height: 5\' 3"  (1.6 m)  ? ? ?Physical Exam ?Vitals and nursing note reviewed.  ?Constitutional:   ?   General: She is not in acute distress. ?   Appearance: Normal appearance.  ?HENT:  ?   Head: Normocephalic and atraumatic.  ?Eyes:  ?   Conjunctiva/sclera: Conjunctivae normal.  ?   Pupils: Pupils are equal, round, and reactive to light.  ?Pulmonary:  ?   Effort: Pulmonary effort is normal. No respiratory distress.  ?Abdominal:  ?   Palpations: Abdomen is soft.  ?   Tenderness: There is no abdominal tenderness.  ?Skin: ?   General: Skin is warm and  dry.  ?Neurological:  ?   Mental Status: She is alert.  ?Psychiatric:     ?   Mood and Affect: Mood normal.     ?   Behavior: Behavior normal.  ? ?NST:  Baseline: 135 bpm, Variability: Good {> 6 bpm), Accelerations: Reactive, and Decelerations: Absent ? ?MAU Course  ?Procedures ? ?MDM ?Increased movement since arriving to MAU. Reactive NST. Documented 20 movements in 30 minutes.  ? ?Assessment and Plan  ? ?1. Decreased fetal movements in third trimester, single or unspecified fetus   ?2. NST (non-stress test) reactive   ?3. [redacted] weeks gestation of pregnancy   ? ?-Reviewed fetal kick counts & reasons to return to MAU ? ? ?Judeth Horn, NP ?12/30/2021 4:37 AM  ? ? ?

## 2022-01-01 ENCOUNTER — Other Ambulatory Visit: Payer: Self-pay | Admitting: *Deleted

## 2022-01-01 DIAGNOSIS — O365990 Maternal care for other known or suspected poor fetal growth, unspecified trimester, not applicable or unspecified: Secondary | ICD-10-CM

## 2022-01-01 DIAGNOSIS — O09893 Supervision of other high risk pregnancies, third trimester: Secondary | ICD-10-CM

## 2022-01-03 ENCOUNTER — Ambulatory Visit: Payer: Medicaid Other | Admitting: *Deleted

## 2022-01-03 ENCOUNTER — Ambulatory Visit: Payer: Medicaid Other | Attending: Obstetrics and Gynecology

## 2022-01-03 VITALS — BP 111/63 | HR 72

## 2022-01-03 DIAGNOSIS — O365990 Maternal care for other known or suspected poor fetal growth, unspecified trimester, not applicable or unspecified: Secondary | ICD-10-CM | POA: Diagnosis present

## 2022-01-03 DIAGNOSIS — O26899 Other specified pregnancy related conditions, unspecified trimester: Secondary | ICD-10-CM | POA: Diagnosis present

## 2022-01-03 DIAGNOSIS — Z141 Cystic fibrosis carrier: Secondary | ICD-10-CM | POA: Insufficient documentation

## 2022-01-03 DIAGNOSIS — O09893 Supervision of other high risk pregnancies, third trimester: Secondary | ICD-10-CM | POA: Diagnosis present

## 2022-01-03 DIAGNOSIS — Z6791 Unspecified blood type, Rh negative: Secondary | ICD-10-CM | POA: Insufficient documentation

## 2022-01-07 ENCOUNTER — Ambulatory Visit (INDEPENDENT_AMBULATORY_CARE_PROVIDER_SITE_OTHER): Payer: Medicaid Other | Admitting: Family Medicine

## 2022-01-07 ENCOUNTER — Encounter: Payer: Self-pay | Admitting: Family Medicine

## 2022-01-07 VITALS — BP 115/69 | HR 84 | Wt 184.1 lb

## 2022-01-07 DIAGNOSIS — Z3403 Encounter for supervision of normal first pregnancy, third trimester: Secondary | ICD-10-CM

## 2022-01-07 DIAGNOSIS — O365930 Maternal care for other known or suspected poor fetal growth, third trimester, not applicable or unspecified: Secondary | ICD-10-CM

## 2022-01-07 DIAGNOSIS — Z6791 Unspecified blood type, Rh negative: Secondary | ICD-10-CM

## 2022-01-07 DIAGNOSIS — O365990 Maternal care for other known or suspected poor fetal growth, unspecified trimester, not applicable or unspecified: Secondary | ICD-10-CM | POA: Insufficient documentation

## 2022-01-07 DIAGNOSIS — O26899 Other specified pregnancy related conditions, unspecified trimester: Secondary | ICD-10-CM

## 2022-01-07 NOTE — Progress Notes (Signed)
? ?  Subjective:  ?Chloe Chen is a 26 y.o. G1P0000 at [redacted]w[redacted]d being seen today for ongoing prenatal care.  She is currently monitored for the following issues for this high-risk pregnancy and has Supervision of low-risk first pregnancy; Rh negative state in antepartum period; Cystic fibrosis carrier; and IUGR (intrauterine growth restriction) affecting care of mother on their problem list. ? ?Patient reports no complaints.  Contractions: Irritability. Vag. Bleeding: None.  Movement: Present. Denies leaking of fluid.  ? ?The following portions of the patient's history were reviewed and updated as appropriate: allergies, current medications, past family history, past medical history, past social history, past surgical history and problem list. Problem list updated. ? ?Objective:  ? ?Vitals:  ? 01/07/22 1546  ?BP: 115/69  ?Pulse: 84  ?Weight: 184 lb 1.6 oz (83.5 kg)  ? ? ?Fetal Status: Fetal Heart Rate (bpm): 152   Movement: Present    ? ?General:  Alert, oriented and cooperative. Patient is in no acute distress.  ?Skin: Skin is warm and dry. No rash noted.   ?Cardiovascular: Normal heart rate noted  ?Respiratory: Normal respiratory effort, no problems with respiration noted  ?Abdomen: Soft, gravid, appropriate for gestational age. Pain/Pressure: Absent     ?Pelvic: Vag. Bleeding: None     ?Cervical exam deferred        ?Extremities: Normal range of motion.     ?Mental Status: Normal mood and affect. Normal behavior. Normal judgment and thought content.  ? ?Urinalysis:     ? ?Assessment and Plan:  ?Pregnancy: G1P0000 at [redacted]w[redacted]d ? ?1. Rh negative state in antepartum period ?S/p rhogam 11/08/2021 ? ?2. Encounter for supervision of low-risk first pregnancy in third trimester ?BP and FHR normal ?Swabs next visit ? ?3. Poor fetal growth affecting management of mother in third trimester, single or unspecified fetus ?EFW 8% last growth scan, has been getting dopplers which have been normal to date ?Has growth scan next week to  determine timing of delivery ? ? ?Preterm labor symptoms and general obstetric precautions including but not limited to vaginal bleeding, contractions, leaking of fluid and fetal movement were reviewed in detail with the patient. ?Please refer to After Visit Summary for other counseling recommendations.  ?Return in 1 week (on 01/14/2022) for Dyad patient, ob visit. ? ? ?Clarnce Flock, MD ? ?

## 2022-01-07 NOTE — Patient Instructions (Signed)

## 2022-01-10 ENCOUNTER — Ambulatory Visit: Payer: Medicaid Other | Admitting: *Deleted

## 2022-01-10 ENCOUNTER — Ambulatory Visit: Payer: Medicaid Other | Attending: Obstetrics and Gynecology

## 2022-01-10 VITALS — BP 116/71 | HR 87

## 2022-01-10 DIAGNOSIS — O365990 Maternal care for other known or suspected poor fetal growth, unspecified trimester, not applicable or unspecified: Secondary | ICD-10-CM

## 2022-01-10 DIAGNOSIS — O26899 Other specified pregnancy related conditions, unspecified trimester: Secondary | ICD-10-CM | POA: Diagnosis present

## 2022-01-10 DIAGNOSIS — Z141 Cystic fibrosis carrier: Secondary | ICD-10-CM | POA: Diagnosis present

## 2022-01-10 DIAGNOSIS — O09893 Supervision of other high risk pregnancies, third trimester: Secondary | ICD-10-CM | POA: Diagnosis not present

## 2022-01-10 DIAGNOSIS — Z6791 Unspecified blood type, Rh negative: Secondary | ICD-10-CM | POA: Diagnosis present

## 2022-01-16 ENCOUNTER — Other Ambulatory Visit (HOSPITAL_COMMUNITY)
Admission: RE | Admit: 2022-01-16 | Discharge: 2022-01-16 | Disposition: A | Discharge status: Home / Self Care | Payer: Medicaid Other | Source: Ambulatory Visit | Attending: Family Medicine | Admitting: Family Medicine

## 2022-01-16 ENCOUNTER — Ambulatory Visit (INDEPENDENT_AMBULATORY_CARE_PROVIDER_SITE_OTHER): Payer: Medicaid Other | Admitting: Family Medicine

## 2022-01-16 VITALS — BP 109/72 | HR 81 | Wt 189.9 lb

## 2022-01-16 DIAGNOSIS — O0990 Supervision of high risk pregnancy, unspecified, unspecified trimester: Secondary | ICD-10-CM

## 2022-01-16 DIAGNOSIS — Z3A36 36 weeks gestation of pregnancy: Secondary | ICD-10-CM | POA: Insufficient documentation

## 2022-01-16 DIAGNOSIS — O26899 Other specified pregnancy related conditions, unspecified trimester: Secondary | ICD-10-CM

## 2022-01-16 DIAGNOSIS — Z3403 Encounter for supervision of normal first pregnancy, third trimester: Secondary | ICD-10-CM | POA: Diagnosis present

## 2022-01-16 DIAGNOSIS — Z3A37 37 weeks gestation of pregnancy: Secondary | ICD-10-CM

## 2022-01-16 DIAGNOSIS — O365930 Maternal care for other known or suspected poor fetal growth, third trimester, not applicable or unspecified: Secondary | ICD-10-CM

## 2022-01-16 DIAGNOSIS — Z6791 Unspecified blood type, Rh negative: Secondary | ICD-10-CM

## 2022-01-16 NOTE — Progress Notes (Addendum)
? ?  PRENATAL VISIT NOTE ? ?Subjective:  ?Chloe Chen is a 26 y.o. G1P0000 at [redacted]w[redacted]d being seen today for ongoing prenatal care.  She is currently monitored for the following issues for this high-risk pregnancy and has Supervision of high risk pregnancy, antepartum; Rh negative state in antepartum period; Cystic fibrosis carrier; and IUGR (intrauterine growth restriction) affecting care of mother on their problem list. ? ?Patient reports no complaints.  Contractions: Irritability. Vag. Bleeding: None.  Movement: Present. Denies leaking of fluid.  ? ?The following portions of the patient's history were reviewed and updated as appropriate: allergies, current medications, past family history, past medical history, past social history, past surgical history and problem list.  ? ?Objective:  ? ?Vitals:  ? 01/16/22 1509  ?BP: 109/72  ?Pulse: 81  ?Weight: 189 lb 14.4 oz (86.1 kg)  ? ? ?Fetal Status: Fetal Heart Rate (bpm): 156 Fundal Height: 36 cm Movement: Present  Presentation: Vertex ? ?General:  Alert, oriented and cooperative. Patient is in no acute distress.  ?Skin: Skin is warm and dry. No rash noted.   ?Cardiovascular: Normal heart rate noted  ?Respiratory: Normal respiratory effort, no problems with respiration noted  ?Abdomen: Soft, gravid, appropriate for gestational age.  Pain/Pressure: Present     ?Pelvic: Cervical exam deferred Dilation: 1 Effacement (%): 50 Station: -2  ?Extremities: Normal range of motion.     ?Mental Status: Normal mood and affect. Normal behavior. Normal judgment and thought content.  ? ?Assessment and Plan:  ?Pregnancy: G1P0000 at [redacted]w[redacted]d ?1. Rh negative state in antepartum period ?Rhogam received 3/3 ? ?2. Encounter for supervision of low-risk first pregnancy in third trimester ?Up to date ?Reviewed baby readiness-- has car seat, cribs, and breast pump ?- Culture, beta strep (group b only) ?- GC/Chlamydia probe amp ([place])not at [facility] ? ?3. [redacted] weeks gestation of pregnancy ?- Culture,  beta strep (group b only) ?- GC/Chlamydia probe amp ([place])not at [facility] ? ?4. Poor fetal growth affecting management of mother in third trimester, single or unspecified fetus ?Has [place] on 5/12 to help with plan ?38-39 week IOL  ?Patient's partner Legrand Como will be out of town 5/19-5/20 due  taking chiropractic boards.  ?Sent in IOL request for 5/22 ([redacted]w[redacted]d) ?Orders placed for IOl ? ?Term labor symptoms and general obstetric precautions including but not limited to vaginal bleeding, contractions, leaking of fluid and fetal movement were reviewed in detail with the patient. ?Please refer to After Visit Summary for other counseling recommendations.  ? ?Return in about 5 days (around 01/21/2022) for scheduled visit, Mom+Baby Combined Care. ? ?Future Appointments  ?Date Time Provider [place]  ?01/17/2022 12:30 PM [facility] NURSE [facility]  ?01/17/2022 12:45 PM [facility] US5 [facility]  ?01/21/2022  2:15 PM Clarnce Flock, MD [facility]  ?01/24/2022  1:15 PM [facility] NURSE [facility]  ?01/24/2022  1:30 PM [facility] US2 [facility]  ?01/29/2022  1:35 PM Clarnce Flock, MD [facility]  ?02/06/2022  3:55 PM Donnamae Jude, MD [facility]  ? ? ?Caren Macadam, MD ?

## 2022-01-16 NOTE — Addendum Note (Signed)
Addended by: Caryl Bis on: 01/16/2022 03:43 PM ? ? Modules accepted: Orders ? ?

## 2022-01-17 ENCOUNTER — Telehealth (HOSPITAL_COMMUNITY): Payer: Self-pay | Admitting: *Deleted

## 2022-01-17 ENCOUNTER — Encounter (HOSPITAL_COMMUNITY): Payer: Self-pay

## 2022-01-17 ENCOUNTER — Encounter: Payer: Self-pay | Admitting: *Deleted

## 2022-01-17 ENCOUNTER — Ambulatory Visit: Payer: Medicaid Other | Admitting: *Deleted

## 2022-01-17 ENCOUNTER — Ambulatory Visit: Payer: Medicaid Other | Attending: Obstetrics

## 2022-01-17 VITALS — BP 119/60 | HR 69

## 2022-01-17 DIAGNOSIS — O26899 Other specified pregnancy related conditions, unspecified trimester: Secondary | ICD-10-CM | POA: Diagnosis present

## 2022-01-17 DIAGNOSIS — Z6791 Unspecified blood type, Rh negative: Secondary | ICD-10-CM | POA: Diagnosis present

## 2022-01-17 DIAGNOSIS — O09893 Supervision of other high risk pregnancies, third trimester: Secondary | ICD-10-CM | POA: Diagnosis present

## 2022-01-17 DIAGNOSIS — O365990 Maternal care for other known or suspected poor fetal growth, unspecified trimester, not applicable or unspecified: Secondary | ICD-10-CM | POA: Insufficient documentation

## 2022-01-17 DIAGNOSIS — O0990 Supervision of high risk pregnancy, unspecified, unspecified trimester: Secondary | ICD-10-CM | POA: Diagnosis present

## 2022-01-17 DIAGNOSIS — Z141 Cystic fibrosis carrier: Secondary | ICD-10-CM | POA: Insufficient documentation

## 2022-01-17 NOTE — Telephone Encounter (Signed)
Preadmission screen  

## 2022-01-20 ENCOUNTER — Telehealth (HOSPITAL_COMMUNITY): Payer: Self-pay | Admitting: *Deleted

## 2022-01-20 LAB — GC/CHLAMYDIA PROBE AMP (~~LOC~~) NOT AT ARMC
Chlamydia: NEGATIVE
Comment: NEGATIVE
Comment: NORMAL
Neisseria Gonorrhea: NEGATIVE

## 2022-01-20 NOTE — Telephone Encounter (Signed)
Preadmission screen  

## 2022-01-21 ENCOUNTER — Ambulatory Visit (INDEPENDENT_AMBULATORY_CARE_PROVIDER_SITE_OTHER): Payer: Medicaid Other | Admitting: Family Medicine

## 2022-01-21 ENCOUNTER — Encounter: Payer: Self-pay | Admitting: Family Medicine

## 2022-01-21 ENCOUNTER — Telehealth (HOSPITAL_COMMUNITY): Payer: Self-pay | Admitting: *Deleted

## 2022-01-21 VITALS — BP 114/72 | HR 73 | Wt 188.5 lb

## 2022-01-21 DIAGNOSIS — O0990 Supervision of high risk pregnancy, unspecified, unspecified trimester: Secondary | ICD-10-CM | POA: Diagnosis not present

## 2022-01-21 DIAGNOSIS — O365930 Maternal care for other known or suspected poor fetal growth, third trimester, not applicable or unspecified: Secondary | ICD-10-CM | POA: Diagnosis not present

## 2022-01-21 DIAGNOSIS — O26899 Other specified pregnancy related conditions, unspecified trimester: Secondary | ICD-10-CM | POA: Diagnosis not present

## 2022-01-21 DIAGNOSIS — Z6791 Unspecified blood type, Rh negative: Secondary | ICD-10-CM | POA: Diagnosis not present

## 2022-01-21 LAB — CULTURE, BETA STREP (GROUP B ONLY): Strep Gp B Culture: NEGATIVE

## 2022-01-21 NOTE — Progress Notes (Signed)
? ?  Subjective:  ?Chloe Chen is a 26 y.o. G1P0000 at [redacted]w[redacted]d being seen today for ongoing prenatal care.  She is currently monitored for the following issues for this low-risk pregnancy and has Supervision of high risk pregnancy, antepartum; Rh negative state in antepartum period; Cystic fibrosis carrier; and IUGR (intrauterine growth restriction) affecting care of mother on their problem list. ? ?Patient reports no complaints.  Contractions: Irritability. Vag. Bleeding: None.  Movement: Present. Denies leaking of fluid.  ? ?The following portions of the patient's history were reviewed and updated as appropriate: allergies, current medications, past family history, past medical history, past social history, past surgical history and problem list. Problem list updated. ? ?Objective:  ? ?Vitals:  ? 01/21/22 1437  ?BP: 114/72  ?Pulse: 73  ?Weight: 188 lb 8 oz (85.5 kg)  ? ? ?Fetal Status: Fetal Heart Rate (bpm): 140   Movement: Present    ? ?General:  Alert, oriented and cooperative. Patient is in no acute distress.  ?Skin: Skin is warm and dry. No rash noted.   ?Cardiovascular: Normal heart rate noted  ?Respiratory: Normal respiratory effort, no problems with respiration noted  ?Abdomen: Soft, gravid, appropriate for gestational age. Pain/Pressure: Present     ?Pelvic: Vag. Bleeding: None     ?Cervical exam deferred        ?Extremities: Normal range of motion.     ?Mental Status: Normal mood and affect. Normal behavior. Normal judgment and thought content.  ? ?Urinalysis:     ? ?Assessment and Plan:  ?Pregnancy: G1P0000 at [redacted]w[redacted]d ? ?1. Rh negative state in antepartum period ?S/p rhogam ? ?2. Supervision of high risk pregnancy, antepartum ?BP and FHR normal ? ?3. Poor fetal growth affecting management of mother in third trimester, single or unspecified fetus ?Resolved on most recent scan, 17% on 01/17/2022 ?Has IOL scheduled for 01/27/2022 ? ?Term labor symptoms and general obstetric precautions including but not limited  to vaginal bleeding, contractions, leaking of fluid and fetal movement were reviewed in detail with the patient. ?Please refer to After Visit Summary for other counseling recommendations.  ?Return in 7 weeks (on 03/11/2022) for PP check, Dyad patient. ? ? ?Venora Maples, MD ? ?

## 2022-01-21 NOTE — Telephone Encounter (Signed)
Preadmission screen  

## 2022-01-21 NOTE — Patient Instructions (Signed)

## 2022-01-22 ENCOUNTER — Telehealth (HOSPITAL_COMMUNITY): Payer: Self-pay | Admitting: *Deleted

## 2022-01-22 ENCOUNTER — Other Ambulatory Visit: Payer: Self-pay | Admitting: Advanced Practice Midwife

## 2022-01-22 NOTE — Telephone Encounter (Signed)
Preadmission screen  

## 2022-01-23 ENCOUNTER — Telehealth (HOSPITAL_COMMUNITY): Payer: Self-pay | Admitting: *Deleted

## 2022-01-23 NOTE — Telephone Encounter (Signed)
Preadmission screen  

## 2022-01-24 ENCOUNTER — Other Ambulatory Visit: Payer: Self-pay | Admitting: Obstetrics

## 2022-01-24 ENCOUNTER — Ambulatory Visit (HOSPITAL_BASED_OUTPATIENT_CLINIC_OR_DEPARTMENT_OTHER): Payer: Medicaid Other | Admitting: *Deleted

## 2022-01-24 ENCOUNTER — Telehealth (HOSPITAL_COMMUNITY): Payer: Self-pay | Admitting: *Deleted

## 2022-01-24 ENCOUNTER — Ambulatory Visit: Payer: Medicaid Other | Admitting: *Deleted

## 2022-01-24 ENCOUNTER — Encounter (HOSPITAL_COMMUNITY): Payer: Self-pay | Admitting: *Deleted

## 2022-01-24 ENCOUNTER — Ambulatory Visit: Payer: Medicaid Other | Attending: Family Medicine

## 2022-01-24 VITALS — BP 110/77 | HR 88

## 2022-01-24 DIAGNOSIS — O365990 Maternal care for other known or suspected poor fetal growth, unspecified trimester, not applicable or unspecified: Secondary | ICD-10-CM

## 2022-01-24 DIAGNOSIS — Z3A38 38 weeks gestation of pregnancy: Secondary | ICD-10-CM | POA: Diagnosis not present

## 2022-01-24 DIAGNOSIS — O365930 Maternal care for other known or suspected poor fetal growth, third trimester, not applicable or unspecified: Secondary | ICD-10-CM | POA: Diagnosis not present

## 2022-01-24 DIAGNOSIS — O0990 Supervision of high risk pregnancy, unspecified, unspecified trimester: Secondary | ICD-10-CM | POA: Insufficient documentation

## 2022-01-24 DIAGNOSIS — O283 Abnormal ultrasonic finding on antenatal screening of mother: Secondary | ICD-10-CM | POA: Insufficient documentation

## 2022-01-24 DIAGNOSIS — O26899 Other specified pregnancy related conditions, unspecified trimester: Secondary | ICD-10-CM | POA: Insufficient documentation

## 2022-01-24 DIAGNOSIS — Z141 Cystic fibrosis carrier: Secondary | ICD-10-CM | POA: Diagnosis not present

## 2022-01-24 DIAGNOSIS — O09893 Supervision of other high risk pregnancies, third trimester: Secondary | ICD-10-CM | POA: Insufficient documentation

## 2022-01-24 DIAGNOSIS — Z6791 Unspecified blood type, Rh negative: Secondary | ICD-10-CM | POA: Insufficient documentation

## 2022-01-24 NOTE — Procedures (Signed)
Chloe Chen [birth year] [redacted]w[redacted]d  Fetus A Non-Stress Test Interpretation for 01/24/22  Indication: Unsatisfactory BPP  Fetal Heart Rate A Mode: External Baseline Rate (A): 130 bpm Variability: Moderate Decelerations: None Multiple birth?: No  Uterine Activity Mode: Palpation, Toco Contraction Frequency (min): ui Resting Tone Palpated: Relaxed  Interpretation (Fetal Testing) Nonstress Test Interpretation: Reactive Overall Impression: Reassuring for gestational age Comments: Dr. Grace Bushy reviwed tracing

## 2022-01-24 NOTE — Telephone Encounter (Signed)
Preadmission screen  

## 2022-01-27 ENCOUNTER — Other Ambulatory Visit: Payer: Self-pay

## 2022-01-27 ENCOUNTER — Inpatient Hospital Stay (HOSPITAL_COMMUNITY)
Admission: AD | Admit: 2022-01-27 | Discharge: 2022-01-29 | DRG: 807 | Disposition: A | Discharge status: Home / Self Care | Payer: Medicaid Other | Attending: Family Medicine | Admitting: Family Medicine

## 2022-01-27 ENCOUNTER — Encounter (HOSPITAL_COMMUNITY): Payer: Self-pay | Admitting: Family Medicine

## 2022-01-27 ENCOUNTER — Inpatient Hospital Stay (HOSPITAL_COMMUNITY): Admission: AD | Admit: 2022-01-27 | Payer: Medicaid Other | Source: Home / Self Care | Admitting: Family Medicine

## 2022-01-27 ENCOUNTER — Inpatient Hospital Stay (HOSPITAL_COMMUNITY): Payer: Medicaid Other

## 2022-01-27 DIAGNOSIS — Z3A38 38 weeks gestation of pregnancy: Secondary | ICD-10-CM

## 2022-01-27 DIAGNOSIS — O0990 Supervision of high risk pregnancy, unspecified, unspecified trimester: Secondary | ICD-10-CM

## 2022-01-27 DIAGNOSIS — D509 Iron deficiency anemia, unspecified: Secondary | ICD-10-CM | POA: Diagnosis present

## 2022-01-27 DIAGNOSIS — O365930 Maternal care for other known or suspected poor fetal growth, third trimester, not applicable or unspecified: Principal | ICD-10-CM | POA: Diagnosis present

## 2022-01-27 DIAGNOSIS — O9902 Anemia complicating childbirth: Secondary | ICD-10-CM | POA: Diagnosis present

## 2022-01-27 DIAGNOSIS — O26893 Other specified pregnancy related conditions, third trimester: Secondary | ICD-10-CM | POA: Diagnosis present

## 2022-01-27 DIAGNOSIS — Z6791 Unspecified blood type, Rh negative: Secondary | ICD-10-CM | POA: Diagnosis not present

## 2022-01-27 DIAGNOSIS — O365990 Maternal care for other known or suspected poor fetal growth, unspecified trimester, not applicable or unspecified: Secondary | ICD-10-CM | POA: Diagnosis present

## 2022-01-27 DIAGNOSIS — Z141 Cystic fibrosis carrier: Secondary | ICD-10-CM | POA: Diagnosis not present

## 2022-01-27 DIAGNOSIS — O26899 Other specified pregnancy related conditions, unspecified trimester: Secondary | ICD-10-CM

## 2022-01-27 DIAGNOSIS — O99214 Obesity complicating childbirth: Secondary | ICD-10-CM | POA: Diagnosis present

## 2022-01-27 LAB — TYPE AND SCREEN
ABO/RH(D): O NEG
Antibody Screen: POSITIVE

## 2022-01-27 LAB — RAPID HIV SCREEN (HIV 1/2 AB+AG)
HIV 1/2 Antibodies: NONREACTIVE
HIV-1 P24 Antigen - HIV24: NONREACTIVE

## 2022-01-27 LAB — CBC
HCT: 33.1 % — ABNORMAL LOW (ref 36.0–46.0)
Hemoglobin: 10.6 g/dL — ABNORMAL LOW (ref 12.0–15.0)
MCH: 26.2 pg (ref 26.0–34.0)
MCHC: 32 g/dL (ref 30.0–36.0)
MCV: 81.7 fL (ref 80.0–100.0)
Platelets: 250 10*3/uL (ref 150–400)
RBC: 4.05 MIL/uL (ref 3.87–5.11)
RDW: 14.2 % (ref 11.5–15.5)
WBC: 9.6 10*3/uL (ref 4.0–10.5)
nRBC: 0 % (ref 0.0–0.2)

## 2022-01-27 MED ORDER — PHENYLEPHRINE 80 MCG/ML (10ML) SYRINGE FOR IV PUSH (FOR BLOOD PRESSURE SUPPORT): 80.0000 ug | PREFILLED_SYRINGE | INTRAVENOUS | Status: DC | PRN

## 2022-01-27 MED ORDER — MISOPROSTOL 25 MCG QUARTER TABLET
25.0000 ug | ORAL_TABLET | ORAL | Status: DC | PRN
Start: 2022-01-27 — End: 2022-01-28
  Administered 2022-01-27 (×2): 25 ug via VAGINAL
  Filled 2022-01-27 (×2): qty 1

## 2022-01-27 MED ORDER — PHENYLEPHRINE 80 MCG/ML (10ML) SYRINGE FOR IV PUSH (FOR BLOOD PRESSURE SUPPORT)
80.0000 ug | PREFILLED_SYRINGE | INTRAVENOUS | Status: DC | PRN
  Filled 2022-01-27: qty 10

## 2022-01-27 MED ORDER — TERBUTALINE SULFATE 1 MG/ML IJ SOLN: 0.2500 mg | Freq: Once | INTRAMUSCULAR | Status: DC | PRN

## 2022-01-27 MED ORDER — LACTATED RINGERS IV SOLN
500.0000 mL | Freq: Once | INTRAVENOUS | Status: AC
  Administered 2022-01-28: 500 mL via INTRAVENOUS

## 2022-01-27 MED ORDER — SOD CITRATE-CITRIC ACID 500-334 MG/5ML PO SOLN: 30.0000 mL | ORAL | Status: DC | PRN

## 2022-01-27 MED ORDER — FENTANYL CITRATE (PF) 100 MCG/2ML IJ SOLN
100.0000 ug | INTRAMUSCULAR | Status: DC | PRN
  Administered 2022-01-27 – 2022-01-28 (×3): 100 ug via INTRAVENOUS
  Filled 2022-01-27 (×3): qty 2

## 2022-01-27 MED ORDER — LACTATED RINGERS IV SOLN: 500.0000 mL | INTRAVENOUS | Status: DC | PRN

## 2022-01-27 MED ORDER — LIDOCAINE HCL (PF) 1 % IJ SOLN
30.0000 mL | INTRAMUSCULAR | Status: DC | PRN
Start: 2022-01-27 — End: 2022-01-28

## 2022-01-27 MED ORDER — EPHEDRINE 5 MG/ML INJ: 10.0000 mg | INTRAVENOUS | Status: DC | PRN

## 2022-01-27 MED ORDER — OXYTOCIN-SODIUM CHLORIDE 30-0.9 UT/500ML-% IV SOLN
2.5000 [IU]/h | INTRAVENOUS | Status: DC
  Filled 2022-01-27: qty 500

## 2022-01-27 MED ORDER — DIPHENHYDRAMINE HCL 50 MG/ML IJ SOLN: 12.5000 mg | INTRAMUSCULAR | Status: DC | PRN

## 2022-01-27 MED ORDER — ACETAMINOPHEN 325 MG PO TABS: 650.0000 mg | ORAL_TABLET | ORAL | Status: DC | PRN

## 2022-01-27 MED ORDER — OXYTOCIN-SODIUM CHLORIDE 30-0.9 UT/500ML-% IV SOLN
1.0000 m[IU]/min | INTRAVENOUS | Status: DC
  Administered 2022-01-27: 2 m[IU]/min via INTRAVENOUS

## 2022-01-27 MED ORDER — ONDANSETRON HCL 4 MG/2ML IJ SOLN
4.0000 mg | Freq: Four times a day (QID) | INTRAMUSCULAR | Status: DC | PRN
Start: 2022-01-27 — End: 2022-01-28

## 2022-01-27 MED ORDER — LACTATED RINGERS IV SOLN: INTRAVENOUS | Status: DC

## 2022-01-27 MED ORDER — OXYTOCIN BOLUS FROM INFUSION
333.0000 mL | Freq: Once | INTRAVENOUS | Status: AC
  Administered 2022-01-28: 333 mL via INTRAVENOUS

## 2022-01-27 MED ORDER — OXYCODONE-ACETAMINOPHEN 5-325 MG PO TABS: 2.0000 | ORAL_TABLET | ORAL | Status: DC | PRN

## 2022-01-27 MED ORDER — FENTANYL-BUPIVACAINE-NACL 0.5-0.125-0.9 MG/250ML-% EP SOLN
12.0000 mL/h | EPIDURAL | Status: DC | PRN
  Administered 2022-01-28: 12 mL/h via EPIDURAL
  Filled 2022-01-27: qty 250

## 2022-01-27 MED ORDER — OXYCODONE-ACETAMINOPHEN 5-325 MG PO TABS: 1.0000 | ORAL_TABLET | ORAL | Status: DC | PRN

## 2022-01-27 NOTE — Progress Notes (Signed)
Labor Progress Note Chloe Chen is a 26 y.o. G1P0000 at [redacted]w[redacted]d who presented for IOL due to FGR, now resolved.   S: Doing well. Walking around room. Coping well with contractions. Partner at bedside.   O:  BP 113/77   Pulse 82   Resp 16   Ht 5\' 3"  (1.6 m)   Wt 86 kg   LMP 05/02/2021   BMI 33.60 kg/m   EFM: Baseline 135 bpm, moderate variability, + accels, no decels  Toco: Every 1-2 minutes   CVE: Dilation: 2 Effacement (%): 60 Station: -1 Presentation: Vertex Exam by:: Dr. [email or web address]  A&P: 26 y.o. G1P0000 [redacted]w[redacted]d   #Labor: Progressing well. Slightly more effaced and fetal head now -1 station. Cervix no longer positioned posteriorly. Contracting every 1-2 minutes. Patient still would like to avoid foley balloon. Will hold on additional Cytotec for now and reassess in 3-4 hours. Will give additional dose of Cytotec if contractions space. Will continue to monitor.  #Pain: PRN; coping well #FWB: Cat 1  #GBS negative  [redacted]w[redacted]d, MD 5:46 PM

## 2022-01-27 NOTE — H&P (Signed)
OBSTETRIC ADMISSION HISTORY AND PHYSICAL  Chloe Chen is a 26 y.o. female G1P0000 with IUP at [redacted]w[redacted]d by LMP presenting for IOL due to FGR. She reports +FMs, no LOF, no VB, no blurry vision, headaches, peripheral edema, or RUQ pain.  She plans on breast feeding. She is planning for IUD placement for birth control postpartum.  She received her prenatal care at [facility].   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) RH negative status   Past Medical History: Past Medical History:  Diagnosis Date   Medical history non-contributory     Past Surgical History: Past Surgical History:  Procedure Laterality Date   KNEE SURGERY  02/2019   KNEE SURGERY  06/2019   WISDOM TOOTH EXTRACTION      Obstetrical History: OB History     Gravida  1   Para  0   Term  0   Preterm  0   AB  0   Living  0      SAB  0   IAB  0   Ectopic  0   Multiple  0   Live Births  0           Social History Social History   Socioeconomic History   Marital status: Single    Spouse name: Not on file   Number of children: Not on file   Years of education: Not on file   Highest education level: Not on file  Occupational History   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  Tobacco Use   Smoking status: Never   Smokeless tobacco: Never  Vaping Use   Vaping Use: Never used  Substance and Sexual Activity   Alcohol use: Never   Drug use: Never   Sexual activity: Yes    Birth control/protection: None  Other Topics Concern   Not on file  Social History Narrative   Not on file   Social Determinants of Health   Financial Resource Strain: Not on file  Food Insecurity: No Food Insecurity   Worried About Running Out of Food in the Last Year: Never true   Ran Out of Food in  the Last Year: Never true  Transportation Needs: No Transportation Needs   Lack of Transportation (Medical): No   Lack of Transportation (Non-Medical): No  Physical Activity: Not on file  Stress: Not on file  Social Connections: Not on file    Family History: Family History  Problem Relation Age of Onset   Diabetes Neg Hx    Hypertension Neg Hx    Cancer Neg Hx     Allergies: No Known Allergies  Medications Prior to Admission  Medication Sig Dispense Refill Last Dose   Prenatal Vit-Fe Fumarate-FA (MULTIVITAMIN-PRENATAL) 27-0.8 MG TABS tablet Take 1 tablet by mouth daily at 12 noon.        Review of Systems  All systems reviewed and negative except as stated in HPI  Blood pressure 113/77, pulse 82, resp. rate 16, height 5\' 3"  (1.6 m), weight 86 kg, last menstrual period 05/02/2021.  General appearance: alert, cooperative, and no distress Lungs: normal work of breathing on room air  Heart: normal rate, warm and well perfused  Abdomen: soft, non-tender, gravid  Extremities: no LE edema, normal ROM  Presentation: Cephalic per RN  Fetal monitoring: Baseline 135 bpm, moderate variability, + accels, no decels  Uterine activity: Irregular contractions  Dilation: 2 Effacement (%): 50 Station: -2 Exam by:: Kris Hartmann, RN  Prenatal labs: ABO, Rh: --/--/PENDING (05/22 2683) Antibody: PENDING (05/22 4196) Rubella: Immune (10/28 0000) RPR: Nonreactive (10/28 0000)  HBsAg: Negative (10/28 0000)  HIV: Non-reactive (10/28 0000)  GBS: Negative/-- (05/11 1614)  2 hr Glucola normal  Genetic screening - LR NIPS, AFP negative, 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)  Anatomy US normal   Prenatal Transfer Tool  Maternal Diabetes: No Genetic Screening: LR NIPS, AFP negative, 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 G1P0000 at [redacted]w[redacted]d here for IOL due to FGR in pregnancy, now resolved (EFW 17% on 5/12).   #Labor: Induction started with vaginal Cytotec. Declined foley balloon placement. Will reassess in 4 hours.  #Pain: PRN #FWB: Cat 1  #ID:  GBS neg #MOF: Breast  #MOC: Mirena IUD - unsure if she would like this to be placed post-placental or in office postpartum. Will think about this and discuss closer to delivery.  #Circ:  Desires inpatient   #Rh negative: Plan for Rhogam evaluation postpartum.   Worthy Rancher, MD  01/27/2022, 10:03 AM

## 2022-01-27 NOTE — Progress Notes (Addendum)
Chloe Chen is a 26 y.o. G1P0000 at [redacted]w[redacted]d by LMP admitted for induction of labor due to [place], now resolved.  Subjective: Patient is doing well. She is resting comfortably on the bed with partner at bedside. She is feeling her contractions.   Objective: BP (!) 114/58   Pulse 69   Resp 16   Ht 5\' 3"  (1.6 m)   Wt 86 kg   LMP 05/02/2021   BMI 33.60 kg/m  No intake/output data recorded. No intake/output data recorded.  FHT:  FHR: 130-140 bpm, variability: moderate,  accelerations:  Present,  decelerations:  Absent UC:   irregular SVE:   Dilation: 2.5 Effacement (%): 60 Station: -1 Exam by:: [employer]  Labs: Lab Results  Component Value Date   WBC 9.6 01/27/2022   HGB 10.6 (L) 01/27/2022   HCT 33.1 (L) 01/27/2022   MCV 81.7 01/27/2022   PLT 250 01/27/2022    Assessment / Plan: 26 y.o. G1P0000 [redacted]w[redacted]d   Labor:  Latent phase of labor. Will start Pitocin. Continue to monitor patient. Fetal Wellbeing:  Category I Pain Control:   PRN, we discussed her options including nitrous oxide, IV pain medication, and epidural Anticipated MOD:  NSVD  Leonette Nutting 01/27/2022, 9:18 PM

## 2022-01-27 NOTE — Progress Notes (Deleted)
Foley balloon now out. Ruptured on exam per RN. Contracting irregularly, every 2-7 minutes. Will start Pitocin 2x2 and reassess in 4 hours. Cat 1/reactive FHT.   Evalina Field, MD

## 2022-01-27 NOTE — Progress Notes (Signed)
Labor Progress Note Chloe Chen is a 26 y.o. G1P0000 at [redacted]w[redacted]d who presented for IOL due to FGR.   S: Doing well. No concerns.   O:  BP 113/77   Pulse 82   Resp 16   Ht 5\' 3"  (1.6 m)   Wt 86 kg   LMP 05/02/2021   BMI 33.60 kg/m   EFM: Baseline 135 bpm, moderate variability, + accels, no decels  Toco: Every 1-2 minutes   CVE: Dilation: 2 Effacement (%): 50 Station: -2 Presentation: Vertex Exam by:: [email or web address], RN  A&P: 26 y.o. G1P0000 [redacted]w[redacted]d   #Labor: Progressing well. Additional dose of Cytotec given at 1326. Will reassess in 4 hours.  #Pain: PRN #FWB: Cat 1  #GBS negative  [redacted]w[redacted]d, MD 3:26 PM

## 2022-01-28 ENCOUNTER — Inpatient Hospital Stay (HOSPITAL_COMMUNITY): Payer: Medicaid Other | Admitting: Anesthesiology

## 2022-01-28 ENCOUNTER — Encounter (HOSPITAL_COMMUNITY): Payer: Self-pay | Admitting: Family Medicine

## 2022-01-28 DIAGNOSIS — O365990 Maternal care for other known or suspected poor fetal growth, unspecified trimester, not applicable or unspecified: Secondary | ICD-10-CM

## 2022-01-28 DIAGNOSIS — Z3A38 38 weeks gestation of pregnancy: Secondary | ICD-10-CM

## 2022-01-28 LAB — RPR: RPR Ser Ql: NONREACTIVE

## 2022-01-28 MED ORDER — DIBUCAINE (PERIANAL) 1 % EX OINT: 1.0000 "application " | TOPICAL_OINTMENT | CUTANEOUS | Status: DC | PRN

## 2022-01-28 MED ORDER — MEASLES, MUMPS & RUBELLA VAC IJ SOLR: 0.5000 mL | Freq: Once | INTRAMUSCULAR | Status: DC

## 2022-01-28 MED ORDER — MEDROXYPROGESTERONE ACETATE 150 MG/ML IM SUSP: 150.0000 mg | INTRAMUSCULAR | Status: DC | PRN

## 2022-01-28 MED ORDER — ONDANSETRON HCL 4 MG/2ML IJ SOLN: 4.0000 mg | INTRAMUSCULAR | Status: DC | PRN

## 2022-01-28 MED ORDER — TRANEXAMIC ACID-NACL 1000-0.7 MG/100ML-% IV SOLN
1000.0000 mg | INTRAVENOUS | Status: AC
  Administered 2022-01-28: 1000 mg via INTRAVENOUS

## 2022-01-28 MED ORDER — BENZOCAINE-MENTHOL 20-0.5 % EX AERO
1.0000 "application " | INHALATION_SPRAY | CUTANEOUS | Status: DC | PRN
  Administered 2022-01-28: 1 via TOPICAL
  Filled 2022-01-28: qty 56

## 2022-01-28 MED ORDER — ONDANSETRON HCL 4 MG PO TABS: 4.0000 mg | ORAL_TABLET | ORAL | Status: DC | PRN

## 2022-01-28 MED ORDER — SIMETHICONE 80 MG PO CHEW: 80.0000 mg | CHEWABLE_TABLET | ORAL | Status: DC | PRN

## 2022-01-28 MED ORDER — COCONUT OIL OIL: 1.0000 "application " | TOPICAL_OIL | Status: DC | PRN

## 2022-01-28 MED ORDER — TRANEXAMIC ACID-NACL 1000-0.7 MG/100ML-% IV SOLN
INTRAVENOUS | Status: AC
  Filled 2022-01-28: qty 100

## 2022-01-28 MED ORDER — TETANUS-DIPHTH-ACELL PERTUSSIS 5-2.5-18.5 LF-MCG/0.5 IM SUSY: 0.5000 mL | PREFILLED_SYRINGE | Freq: Once | INTRAMUSCULAR | Status: DC

## 2022-01-28 MED ORDER — ACETAMINOPHEN 325 MG PO TABS: 650.0000 mg | ORAL_TABLET | ORAL | Status: DC | PRN

## 2022-01-28 MED ORDER — DIPHENHYDRAMINE HCL 25 MG PO CAPS
25.0000 mg | ORAL_CAPSULE | Freq: Four times a day (QID) | ORAL | Status: DC | PRN
Start: 2022-01-28 — End: 2022-01-30

## 2022-01-28 MED ORDER — SENNOSIDES-DOCUSATE SODIUM 8.6-50 MG PO TABS
2.0000 | ORAL_TABLET | Freq: Every day | ORAL | Status: DC
  Administered 2022-01-29: 2 via ORAL
  Filled 2022-01-28: qty 2

## 2022-01-28 MED ORDER — WITCH HAZEL-GLYCERIN EX PADS: 1.0000 "application " | MEDICATED_PAD | CUTANEOUS | Status: DC | PRN

## 2022-01-28 MED ORDER — PRENATAL MULTIVITAMIN CH
1.0000 | ORAL_TABLET | Freq: Every day | ORAL | Status: DC
  Administered 2022-01-28 – 2022-01-29 (×2): 1 via ORAL
  Filled 2022-01-28 (×2): qty 1

## 2022-01-28 MED ORDER — LIDOCAINE-EPINEPHRINE (PF) 2 %-1:200000 IJ SOLN
INTRAMUSCULAR | Status: DC | PRN
  Administered 2022-01-28: 5 mL via EPIDURAL

## 2022-01-28 MED ORDER — IBUPROFEN 600 MG PO TABS
600.0000 mg | ORAL_TABLET | Freq: Four times a day (QID) | ORAL | Status: DC
  Administered 2022-01-28 – 2022-01-29 (×5): 600 mg via ORAL
  Filled 2022-01-28 (×5): qty 1

## 2022-01-28 MED ORDER — RHO D IMMUNE GLOBULIN 1500 UNIT/2ML IJ SOSY
300.0000 ug | PREFILLED_SYRINGE | Freq: Once | INTRAMUSCULAR | Status: AC
  Administered 2022-01-29: 300 ug via INTRAVENOUS
  Filled 2022-01-28: qty 2

## 2022-01-28 NOTE — Anesthesia Procedure Notes (Signed)

## 2022-01-28 NOTE — Progress Notes (Addendum)
Chloe Chen is a 26 y.o. G1P0000 at [redacted]w[redacted]d by LMP admitted for induction of labor due to FGR, now resolved.  Subjective: Patient is doing well. She endorses pelvic pressure and feels her contractions. She reports she got up around 0145 and had leakage of fluid. She endorses continued leakage of fluid since then. She requests an epidural after this last dose of fentanyl due to drowsiness.  Objective: BP 115/66   Pulse 62   Temp 98.1 F (36.7 C) (Oral)   Resp 16   Ht 5\' 3"  (1.6 m)   Wt 86 kg   LMP 05/02/2021   BMI 33.60 kg/m  No intake/output data recorded. No intake/output data recorded.  FHT:  FHR: 135 bpm, variability: moderate,  accelerations:  Present,  decelerations:  Absent UC:   2-3 SVE:   Dilation: 5 Effacement (%): 70 Station: -1 Exam by:: Leopoldo Mazzie, CNM  Labs: Lab Results  Component Value Date   WBC 9.6 01/27/2022   HGB 10.6 (L) 01/27/2022   HCT 33.1 (L) 01/27/2022   MCV 81.7 01/27/2022   PLT 250 01/27/2022    Assessment / Plan: 26 y.o. G1P0000 [redacted]w[redacted]d  Labor: Latent phase of labor. SROM at 0145. Continue to titrate pitocin. Fetal Wellbeing:  Category I Pain Control:  IV pain meds, requesting epidural  Anticipated MOD:  NSVD  [redacted]w[redacted]d 01/28/2022, 7:04 AM  Midwife attestation I agree with the documentation in the student's note.   01/30/2022, CNM 7:13 AM

## 2022-01-28 NOTE — Discharge Summary (Signed)
Postpartum Discharge Summary  Date of Service updated***     Patient Name: Chloe Chen DOB: 09-30-1995 MRN: [id number]  Date of admission: 01/27/2022 Delivery date:01/28/2022  Delivering provider: Renard Matter  Date of discharge: 01/28/2022  Admitting diagnosis: IUGR (intrauterine growth restriction) affecting care of mother [O36.5990] Intrauterine pregnancy: [redacted]w[redacted]d     Secondary diagnosis:  Principal Problem:   IUGR (intrauterine growth restriction) affecting care of mother Active Problems:   Supervision of high risk pregnancy, antepartum   Rh negative state in antepartum period   Cystic fibrosis carrier   Vaginal delivery  Additional problems: ***None    Discharge diagnosis: Term Pregnancy Delivered                                              Post partum procedures:*** Augmentation: Pitocin and Cytotec Complications: None  Hospital course: Induction of Labor With Vaginal Delivery   26 y.o. yo G1P0000 at [redacted]w[redacted]d was admitted to the hospital 01/27/2022 for induction of labor.  Indication for induction:  previous FGR .  Patient had an uncomplicated labor course as follows: Membrane Rupture Time/Date: 1:45 AM ,01/28/2022   Delivery Method:Vaginal, Spontaneous  Episiotomy: None  Lacerations:  Labial  Details of delivery can be found in separate delivery note.  Patient had a routine postpartum course. Patient is discharged home 01/28/22.  Newborn Data: Birth date:01/28/2022  Birth time:11:13 AM  Gender:Female  Living status:Living  Apgars:9 ,9  Weight:3050 g   Magnesium Sulfate received: No BMZ received: No Rhophylac:{Rhophylac received:30440032} MMR:N/A T-DaP:Given prenatally Flu: {PNP:00511} Transfusion:{Transfusion received:30440034}  Physical exam  Vitals:   01/28/22 1301 01/28/22 1319 01/28/22 1426 01/28/22 1826  BP: 114/66 114/72 109/78 121/67  Pulse: 73 72 81 77  Resp:  $Remo'17 17 17  'cCAKV$ Temp:  97.9 F (36.6 C) 98.1 F (36.7 C) 98.7 F (37.1 C)  TempSrc:       SpO2:  100% 100% 98%  Weight:      Height:       General: {Exam; general:21111117} Lochia: {Desc; appropriate/inappropriate:30686::"appropriate"} Uterine Fundus: {Desc; firm/soft:30687} Incision: {Exam; incision:21111123} DVT Evaluation: {Exam; dvt:2111122} Labs: Lab Results  Component Value Date   WBC 9.6 01/27/2022   HGB 10.6 (L) 01/27/2022   HCT 33.1 (L) 01/27/2022   MCV 81.7 01/27/2022   PLT 250 01/27/2022       View : No data to display.         Edinburgh Score:     View : No data to display.           After visit meds:  Allergies as of 01/28/2022   No Known Allergies   Med Rec must be completed prior to using this [facility]***        Discharge home in stable condition Infant Feeding: Breast Infant Disposition:{CHL IP OB HOME WITH MYTRZN:35670} Discharge instruction: per After Visit Summary and Postpartum booklet. Activity: Advance as tolerated. Pelvic rest for 6 weeks.  Diet: routine diet Future Appointments: Future Appointments  Date Time Provider [place]  02/27/2022 11:35 AM Caren Macadam, MD [facility]   Follow up Visit: Message sent to M+B combined care pool on 5/23 by Dr. Cy Blamer  This patient is a Mom+Baby Combined care infant. [facility] needs the follow appointments scheduled  Infant needs a newborn weight check -- 2-3 days from discharge. Approximate date 5/24 Infant needs a 2 week weight  check  Infant needs 1 month Well Child Check  Additionally,  Please schedule this patient for a In person postpartum visit in 6 weeks with the following provider:  Dr. Ernestina Patches or Dr. Dione Plover . Additional Postpartum F/U: None   High risk pregnancy complicated by:  previously FGR Delivery mode:  Vaginal, Spontaneous  Anticipated Birth Control:  IUD (outpatient)   01/28/2022 Renard Matter, MD

## 2022-01-28 NOTE — Anesthesia Preprocedure Evaluation (Addendum)
Anesthesia Evaluation  Patient identified by MRN, date of birth, ID band Patient awake    Reviewed: Allergy & Precautions, NPO status , Patient's Chart, lab work & pertinent test results  Airway Mallampati: II  TM Distance: >3 FB Neck ROM: Full    Dental no notable dental hx.    Pulmonary neg pulmonary ROS,    Pulmonary exam normal breath sounds clear to auscultation       Cardiovascular negative cardio ROS Normal cardiovascular exam Rhythm:Regular Rate:Normal     Neuro/Psych negative neurological ROS  negative psych ROS   GI/Hepatic Neg liver ROS, GERD  ,  Endo/Other  negative endocrine ROSObesity  Renal/GU negative Renal ROS  negative genitourinary   Musculoskeletal negative musculoskeletal ROS (+)   Abdominal   Peds  Hematology  (+) Blood dyscrasia, anemia ,   Anesthesia Other Findings IOL for FGR  Reproductive/Obstetrics (+) Pregnancy                            Anesthesia Physical Anesthesia Plan  ASA: 2  Anesthesia Plan: Epidural   Post-op Pain Management:    Induction:   PONV Risk Score and Plan: Treatment may vary due to age or medical condition  Airway Management Planned: Natural Airway  Additional Equipment:   Intra-op Plan:   Post-operative Plan:   Informed Consent: I have reviewed the patients History and Physical, chart, labs and discussed the procedure including the risks, benefits and alternatives for the proposed anesthesia with the patient or authorized representative who has indicated his/her understanding and acceptance.     Dental advisory given  Plan Discussed with: Anesthesiologist  Anesthesia Plan Comments:        Anesthesia Quick Evaluation

## 2022-01-29 ENCOUNTER — Encounter: Payer: PRIVATE HEALTH INSURANCE | Admitting: Family Medicine

## 2022-01-29 ENCOUNTER — Encounter (HOSPITAL_COMMUNITY): Payer: Self-pay | Admitting: Family Medicine

## 2022-01-29 LAB — CBC
HCT: 27.7 % — ABNORMAL LOW (ref 36.0–46.0)
Hemoglobin: 8.9 g/dL — ABNORMAL LOW (ref 12.0–15.0)
MCH: 25.9 pg — ABNORMAL LOW (ref 26.0–34.0)
MCHC: 32.1 g/dL (ref 30.0–36.0)
MCV: 80.5 fL (ref 80.0–100.0)
Platelets: 197 10*3/uL (ref 150–400)
RBC: 3.44 MIL/uL — ABNORMAL LOW (ref 3.87–5.11)
RDW: 14.2 % (ref 11.5–15.5)
WBC: 14.4 10*3/uL — ABNORMAL HIGH (ref 4.0–10.5)
nRBC: 0 % (ref 0.0–0.2)

## 2022-01-29 LAB — BIRTH TISSUE RECOVERY COLLECTION (PLACENTA DONATION)

## 2022-01-29 MED ORDER — FERROUS SULFATE 325 (65 FE) MG PO TABS
325.0000 mg | ORAL_TABLET | ORAL | Status: DC
Start: 2022-01-29 — End: 2022-01-30

## 2022-01-29 MED ORDER — FERROUS SULFATE 325 (65 FE) MG PO TABS: 325.0000 mg | ORAL_TABLET | ORAL | 0 refills | Status: DC

## 2022-01-29 MED ORDER — ACETAMINOPHEN 325 MG PO TABS: 650.0000 mg | ORAL_TABLET | ORAL | 0 refills | Status: AC | PRN

## 2022-01-29 MED ORDER — IBUPROFEN 600 MG PO TABS: 600.0000 mg | ORAL_TABLET | Freq: Four times a day (QID) | ORAL | 0 refills | Status: DC

## 2022-01-29 NOTE — Progress Notes (Signed)
POSTPARTUM PROGRESS NOTE  Subjective: Chloe Chen is a 26 y.o. G1P1001 PPD#1s/p SVD at [redacted]w[redacted]d.  She reports she doing well. No acute events overnight. She denies any problems with ambulating, voiding or po intake. Denies nausea or vomiting. She has  passed flatus. Pain is well controlled.  Lochia is scant.  Objective: Blood pressure 116/76, pulse 78, temperature 98.3 F (36.8 C), temperature source Oral, resp. rate 18, height 5\' 3"  (1.6 m), weight 86 kg, last menstrual period 05/02/2021, SpO2 100 %.  Physical Exam:  General: alert, cooperative and no distress Chest: no respiratory distress Abdomen: soft, non-tender  Uterine Fundus: firm, appropriately tender Extremities: No calf swelling or tenderness  no edema  Recent Labs    01/27/22 0903 01/29/22 0528  HGB 10.6* 8.9*  HCT 33.1* 27.7*    Assessment/Plan: Chloe Chen is a 26 y.o. G1P1001 PPD#1s/p SVD at [redacted]w[redacted]d  Routine Postpartum Care: Doing well, pain well-controlled.  -- Continue routine care, lactation support  -- Contraception: plans outpatient IUD -- Feeding: breast- infant working on latching and feeding. Peds plans to keep baby in hospital today  #Iron deficiency anemia HgB 10.6>8.9, PO iron started  Dispo: Plan for discharge PPD#2 as infant staying for feeding monitoring today. Offered rooming in (with mom being discharged today) but would like to stay as patient.  [redacted]w[redacted]d, MD, MPH OB Fellow, [facility] for [facility]

## 2022-01-29 NOTE — Lactation Note (Signed)
This note was copied from a baby's chart. Lactation Consultation Note Baby is wide awake looking around. Mom stated she can get him latched to the Rt. Breast but has difficulty latching to the Lt. Beast. Noted Lt. Nipple short shaft than Rt. Hand expression w/ colostrum easily expressed. Spoon fed baby for stimulation to want to feed. Baby has no interest in BF at this time. Baby passing a lot of gas. Gave mom baby to hold on her chest.  Shells given to wear in am. Hand pump given for pre-pumping to evert nipples more for deeper latch. Newborn feeding habits, STS, I&O, supply and demand reviewed. Encouraged mom to call for assistance if needed.  Patient Name: Chloe Chen Date: 01/29/2022 Reason for consult: Initial assessment;Primapara;Early term 37-38.6wks Age:27 hours  Maternal Data Has patient been taught Hand Expression?: Yes Does the patient have breastfeeding experience prior to this delivery?: No  Feeding    LATCH Score Latch: Too sleepy or reluctant, no latch achieved, no sucking elicited.  Audible Swallowing: None  Type of Nipple: Everted at rest and after stimulation (short shafts/ Lt. shorter than Rt.)  Comfort (Breast/Nipple): Soft / non-tender  Hold (Positioning): Full assist, staff holds infant at breast  LATCH Score: 4   Lactation Tools Discussed/Used Tools: Shells;Pump Breast pump type: Manual Pump Education: Setup, frequency, and cleaning;Milk Storage Reason for Pumping: pre-pumping  Interventions Interventions: Breast feeding basics reviewed;Assisted with latch;Skin to skin;Breast massage;Hand express;Pre-pump if needed;Reverse pressure;Breast compression;Adjust position;Support pillows;Position options;Expressed milk;Shells;Hand pump;LC Services brochure  Discharge    Consult Status Consult Status: Follow-up Date: 01/29/22 Follow-up type: In-patient    Charyl Dancer 01/29/2022, 4:18 AM

## 2022-01-29 NOTE — Anesthesia Postprocedure Evaluation (Signed)
Anesthesia Post Note  Patient: Chloe Chen  Procedure(s) Performed: AN AD HOC LABOR EPIDURAL     Patient location during evaluation: Mother Baby Anesthesia Type: Epidural Level of consciousness: awake and alert Pain management: pain level controlled Vital Signs Assessment: post-procedure vital signs reviewed and stable Respiratory status: spontaneous breathing, nonlabored ventilation and respiratory function stable Cardiovascular status: stable Postop Assessment: no headache, no backache and epidural receding Anesthetic complications: no   No notable events documented.  Last Vitals:  Vitals:   01/28/22 2230 01/29/22 0230  BP: 109/67 116/76  Pulse: 84 78  Resp: 18 18  Temp: 36.9 C 36.8 C  SpO2: 98% 100%    Last Pain:  Vitals:   01/29/22 0230  TempSrc: Oral  PainSc: 0-No pain   Pain Goal:                   Angelos Wasco

## 2022-01-29 NOTE — Lactation Note (Signed)
This note was copied from a baby's chart. Lactation Consultation Note  Patient Name: Boy Jonie Burdell ZRAQT'M Date: 01/29/2022 Reason for consult: Follow-up assessment;Difficult latch;Early term 37-38.6wks Age:27 hours  Mom latched baby when [facility] entered.  Baby was asleep.  LC undressed infant and placed him STS, mom hand expressed. Baby would not open to latch. Upon suck assessment, infant had clenched jaws and it took awhile to stimulate suck reflex.  Suck was stronger after 2 ml of colostrum finger fed to him. Gumline felt with suck.   Placed belly to belly to allow baby to root.  LC sandwiched tissue to latch and baby grasped breast but would not suck.  Quivering of the entire jaw noted with latch attempt.  Another attempt made and infant did begin sucking but needed continuous stimulation or would fall asleep.    LC encouraged mom to pump using her manual in order to collect milk and bottle feed back to baby in order to assess bottle feeding. LC asked about supplementation and mom states she will give formula.    Mom desires to go home but [facility] strongly recommended if mom wanted to work on the breastfeeding to stay since infant was not latching and was not feeding well.  Would recommend if staying to use the DEBP.,and bottle feed according to supplementation guidelines.   MOm states if she goes home, she will pump, (she has 3) and will bottle feed her EBM and formula.  Per mom has F/U with LC next week.    Mom will call back for [facility] when ready to bottle feed.  Maternal Data Has patient been taught Hand Expression?: Yes  Feeding Mother's Current Feeding Choice: Breast Milk  LATCH Score Latch: Repeated attempts needed to sustain latch, nipple held in mouth throughout feeding, stimulation needed to elicit sucking reflex. (numerous attempts before baby latched, fell asleep after a few sucks)  Audible Swallowing: None (1 swallow heard, multiple attempts at the breast)  Type of Nipple:  Everted at rest and after stimulation  Comfort (Breast/Nipple): Soft / non-tender  Hold (Positioning): Assistance needed to correctly position infant at breast and maintain latch.  LATCH Score: 6   Lactation Tools Discussed/Used Tools: Pump Breast pump type: Manual (mom has electric at home) Reason for Pumping: encouraged to pump to collect milk to feed back to baby  Interventions Interventions: Breast feeding basics reviewed;Assisted with latch;Skin to skin;Hand express;Position options  Discharge Discharge Education: Outpatient recommendation;Outpatient Epic message sent Pump: Manual  Consult Status      Maryruth Hancock [facility] 01/29/2022, 1:04 PM

## 2022-01-30 LAB — RH IG WORKUP (INCLUDES ABO/RH)
Fetal Screen: NEGATIVE
Gestational Age(Wks): 38.5
Unit division: 0

## 2022-02-06 ENCOUNTER — Telehealth (HOSPITAL_COMMUNITY): Payer: Self-pay | Admitting: *Deleted

## 2022-02-06 ENCOUNTER — Encounter: Payer: Medicaid Other | Admitting: Family Medicine

## 2022-02-06 NOTE — Telephone Encounter (Signed)
Voicemail not setup. Unable to leave message.  Odis Hollingshead, RN 02-06-2022 at 12:33pm

## 2022-02-27 ENCOUNTER — Encounter: Payer: Self-pay | Admitting: Family Medicine

## 2022-02-27 ENCOUNTER — Ambulatory Visit (INDEPENDENT_AMBULATORY_CARE_PROVIDER_SITE_OTHER): Payer: Medicaid Other | Admitting: Family Medicine

## 2022-02-27 DIAGNOSIS — Z975 Presence of (intrauterine) contraceptive device: Secondary | ICD-10-CM | POA: Insufficient documentation

## 2022-02-27 DIAGNOSIS — Z30014 Encounter for initial prescription of intrauterine contraceptive device: Secondary | ICD-10-CM

## 2022-02-27 MED ORDER — LEVONORGESTREL 20.1 MCG/DAY IU IUD
1.0000 | INTRAUTERINE_SYSTEM | Freq: Once | INTRAUTERINE | Status: AC
  Administered 2022-02-27: 1 via INTRAUTERINE

## 2022-02-27 NOTE — Progress Notes (Signed)
Post Partum Visit Note  Chloe Chen is a 26 y.o. G42P0000 female who presents for a postpartum visit. She is 4 weeks postpartum following a normal spontaneous vaginal delivery.  I have fully reviewed the prenatal and intrapartum course. The delivery was at 38.5 gestational weeks.  Anesthesia: epidural. Postpartum course has been uncomplicated. Baby is doing well. Baby is feeding by breast. Bleeding staining only. Bowel function is normal. Bladder function is normal. Patient is not sexually active. Contraception method is IUD. Postpartum depression screening: negative.   The pregnancy intention screening data noted above was reviewed. Potential methods of contraception were discussed. The patient elected to proceed with No data recorded.   Edinburgh Postnatal Depression Scale - 02/27/22 1154       Edinburgh Postnatal Depression Scale:  In the Past 7 Days   I have been able to laugh and see the funny side of things. 0    I have looked forward with enjoyment to things. 0    I have blamed myself unnecessarily when things went wrong. 0    I have been anxious or worried for no good reason. 0    I have felt scared or panicky for no good reason. 0    Things have been getting on top of me. 0    I have been so unhappy that I have had difficulty sleeping. 0    I have felt sad or miserable. 0    I have been so unhappy that I have been crying. 0    The thought of harming myself has occurred to me. 0    Edinburgh Postnatal Depression Scale Total 0             Health Maintenance Due  Topic Date Due   PAP-Cervical Cytology Screening  Never done   PAP SMEAR-Modifier  Never done    The following portions of the patient's history were reviewed and updated as appropriate: allergies, current medications, past family history, past medical history, past social history, past surgical history, and problem list.  Review of Systems Pertinent items are noted in HPI.  Objective:  BP 113/75   Pulse  65   Wt 169 lb (76.7 kg)   LMP  (LMP Unknown)   Breastfeeding Yes   BMI 29.94 kg/m    General:  alert, cooperative, and appears stated age   Breasts:  not indicated  Lungs: clear to auscultation bilaterally  Heart:  regular rate and rhythm, S1, S2 normal, no murmur, click, rub or gallop  Abdomen: soft, non-tender; bowel sounds normal; no masses,  no organomegaly   Wound NA  GU exam:  normal        IUD Insertion Procedure Note Patient identified, informed consent performed, consent signed.   Discussed risks of irregular bleeding, cramping, infection, malpositioning or misplacement of the IUD outside the uterus which may require further procedure such as laparoscopy. Time out was performed.    Speculum placed in the vagina.  Cervix visualized.  Cleaned with Betadine x 2. No tenaculum placed.  Uterus sounded to 8 cm.  IUD placed per manufacturer's recommendations.  Strings trimmed to 3 cm.   Patient tolerated procedure well.   Patient was given post-procedure instructions- both agency handout and verbally by provider.  She was advised to have backup contraception for one week.  Patient was also asked to check IUD strings periodically or follow up in 4 weeks for IUD check.  Assessment:   Normal postpartum exam.   Plan:  Essential components of care per ACOG recommendations:  1.  Mood and well being: Patient with negative depression screening today. Reviewed local resources for support.  - Patient tobacco use? No.   - hx of drug use? No.    2. Infant care and feeding:  -Patient currently breastmilk feeding? Yes. Reviewed importance of draining breast regularly to support lactation.  -Social determinants of health (SDOH) reviewed in EPIC. No concerns  3. Sexuality, contraception and birth spacing - Patient does not want a pregnancy in the next year.  Desired family size is 2 children.  - Reviewed reproductive life planning. Reviewed contraceptive methods based on pt preferences  and effectiveness.  Patient desired IUD or IUS today.   - Discussed birth spacing of 18 months  4. Sleep and fatigue -Encouraged family/partner/community support of 4 hrs of uninterrupted sleep to help with mood and fatigue  5. Physical Recovery  - Discussed patients delivery and complications. She describes her labor as good. - Patient had a Vaginal, no problems at delivery. Patient had a  bilateral labial  laceration. Perineal healing reviewed. Patient expressed understanding - Patient has urinary incontinence? No. - Patient is safe to resume physical and sexual activity  6.  Health Maintenance - HM due items addressed Yes - Last pap smear ---Realized patient needed pap after she left-- plan to get at String check in 4 weeks -Breast Cancer screening indicated? No.   7. Chronic Disease/Pregnancy Condition follow up: None  - PCP follow up  No future appointments.   Federico Flake, MD Center for [employer], [facility]

## 2022-05-28 ENCOUNTER — Encounter: Payer: Self-pay | Admitting: Family Medicine

## 2022-08-26 ENCOUNTER — Ambulatory Visit (INDEPENDENT_AMBULATORY_CARE_PROVIDER_SITE_OTHER): Payer: Medicaid Other | Admitting: Family Medicine

## 2022-08-26 ENCOUNTER — Encounter: Payer: Self-pay | Admitting: Family Medicine

## 2022-08-26 VITALS — BP 123/76 | HR 72 | Wt 171.4 lb

## 2022-08-26 DIAGNOSIS — F419 Anxiety disorder, unspecified: Secondary | ICD-10-CM

## 2022-08-26 MED ORDER — HYDROXYZINE PAMOATE 25 MG PO CAPS: 25.0000 mg | ORAL_CAPSULE | Freq: Three times a day (TID) | ORAL | 0 refills | Status: DC | PRN

## 2022-08-26 MED ORDER — SERTRALINE HCL 50 MG PO TABS: 50.0000 mg | ORAL_TABLET | Freq: Every day | ORAL | 2 refills | Status: DC

## 2022-08-26 NOTE — Progress Notes (Signed)
Patient states that last couple months she has noticed she is much anxious and her moods are very up and down. States that she feels like she is always on edge and that it is more so anxious versus depressed. States that she is noticing her mood swings are very up and down and at the "flip of a switch".    Edinburgh screening positive with a 21.

## 2022-08-26 NOTE — Progress Notes (Signed)
[facility] GYNECOLOGY OFFICE VISIT NOTE  History:   Chloe Chen is a 26 y.o. G1P0000 here today for anxiety concern.  She reports that she has always had some anxiety but has not been impacting her life significantly in the past Since delivery of her son though has gotten much worse She is doing a lot to try and counteract it Started working out regularly, eating a better diet, taking Mg supplement at night Talked with her sister who was recently diagnosed with anxiety and encouraged her to come in to talk She is open to meds and counseling  Health Maintenance Due  Topic Date Due   PAP-Cervical Cytology Screening  Never done   PAP SMEAR-Modifier  Never done   INFLUENZA VACCINE  Never done    Past Medical History:  Diagnosis Date   Medical history non-contributory     Past Surgical History:  Procedure Laterality Date   KNEE SURGERY  02/2019   KNEE SURGERY  06/2019   WISDOM TOOTH EXTRACTION      The following portions of the patient's history were reviewed and updated as appropriate: allergies, current medications, past family history, past medical history, past social history, past surgical history and problem list.   Health Maintenance:   Last pap: No results found for: "DIAGPAP", "HPV", "HPVHIGH" *needs*  Last mammogram:  N/a    Review of Systems:  Pertinent items noted in HPI and remainder of comprehensive ROS otherwise negative.  Physical Exam:  BP 123/76   Pulse 72   Wt 171 lb 6.4 oz (77.7 kg)   LMP 08/26/2022 (Exact Date)   Breastfeeding Yes   BMI 30.36 kg/m  CONSTITUTIONAL: Well-developed, well-nourished female in no acute distress.  HEENT:  Normocephalic, atraumatic. External right and left ear normal. No scleral icterus.  NECK: Normal range of motion, supple, no masses noted on observation SKIN: No rash noted. Not diaphoretic. No erythema. No pallor. MUSCULOSKELETAL: Normal range of motion. No edema noted. NEUROLOGIC: Alert and  oriented to person, place, and time. Normal muscle tone coordination.  PSYCHIATRIC: Anxious mood, normal affect RESPIRATORY: Effort normal, no problems with respiration noted   Labs and Imaging No results found for this or any previous visit (from the past 168 hour(s)). No results found.    Assessment and Plan:   Problem List Items Addressed This Visit       Other   Anxiety - Primary    Edinburgh 21 today. We discussed options including starting zoloft/vistaril, IBH referral. She accepts both, start zoloft 50 and recommended QHS dosing to avoid GI side effects, also reviewed common side effects seen at initiation. Vistaril PRN for anxiety as well. Referred to [facility] for counseling.  Prior to finishing visit noted to not have a pap smear on file.  Follow up in 1 month for mood check and pap smear.        Relevant Medications   sertraline (ZOLOFT) 50 MG tablet   hydrOXYzine (VISTARIL) 25 MG capsule   Other Relevant Orders   Ambulatory referral to Integrated Behavioral Health    Routine preventative health maintenance measures emphasized. Please refer to After Visit Summary for other counseling recommendations.   Return in about 4 weeks (around 09/23/2022) for [facility] patient, anxiety check in.    Total face-to-face time with patient: 20 minutes.  Over 50% of encounter was spent on counseling and coordination of care.   Venora Maples, MD/MPH Attending Family Medicine Physician, [facility] for [facility], [facility]  [facility]

## 2022-08-26 NOTE — Assessment & Plan Note (Signed)
Edinburgh 21 today. We discussed options including starting zoloft/vistaril, IBH referral. She accepts both, start zoloft 50 and recommended QHS dosing to avoid GI side effects, also reviewed common side effects seen at initiation. Vistaril PRN for anxiety as well. Referred to [facility] for counseling.  Prior to finishing visit noted to not have a pap smear on file.  Follow up in 1 month for mood check and pap smear.

## 2022-08-29 ENCOUNTER — Telehealth: Payer: Self-pay | Admitting: Clinical

## 2022-08-29 NOTE — Telephone Encounter (Signed)
Attempt call regarding referral; Left HIPPA-compliant message to call back Alie Moudy from [facility] at  [phone] (Fard Borunda's office).    

## 2022-10-03 ENCOUNTER — Telehealth: Payer: Self-pay | Admitting: Clinical

## 2022-10-03 NOTE — Telephone Encounter (Signed)
Attempt call regarding referral; Left HIPPA-compliant message to call back Allayah Raineri from [facility] at  [phone] (Angell Honse's office).    

## 2022-10-13 NOTE — BH Specialist Note (Signed)
Pt did not arrive to video visit and did not answer the phone; Left HIPPA-compliant message to call back Rahima Fleishman from [facility] at  [phone] (Jani Moronta's office).  ?; left MyChart message for patient.  ? ?

## 2022-10-24 ENCOUNTER — Ambulatory Visit: Payer: Medicaid Other | Admitting: Clinical

## 2022-10-24 DIAGNOSIS — Z91199 Patient's noncompliance with other medical treatment and regimen due to unspecified reason: Secondary | ICD-10-CM

## 2023-02-23 ENCOUNTER — Telehealth: Payer: Self-pay

## 2023-02-23 ENCOUNTER — Encounter: Payer: Medicaid Other | Admitting: Family Medicine

## 2023-02-23 NOTE — Telephone Encounter (Signed)
Pt called Nurse line today @ 9:11a requesting for her appointment today to be rescheduled, sent in basket to front desk to get this done.

## 2023-04-06 ENCOUNTER — Encounter: Payer: Self-pay | Admitting: Family Medicine

## 2023-05-03 IMAGING — US US MFM FETAL BPP W/O NON-STRESS
1 series · 13 of 28 positions shown · non-contrast
Comparison: none

[Series 1: us mfm fetal bpp w/o non-stress · 51 acquisitions, 13 frames shown]
[im 2/51]
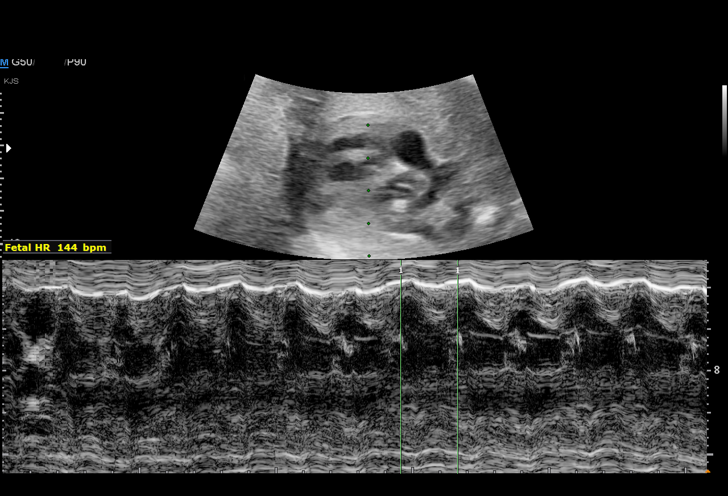
[im 6/51]
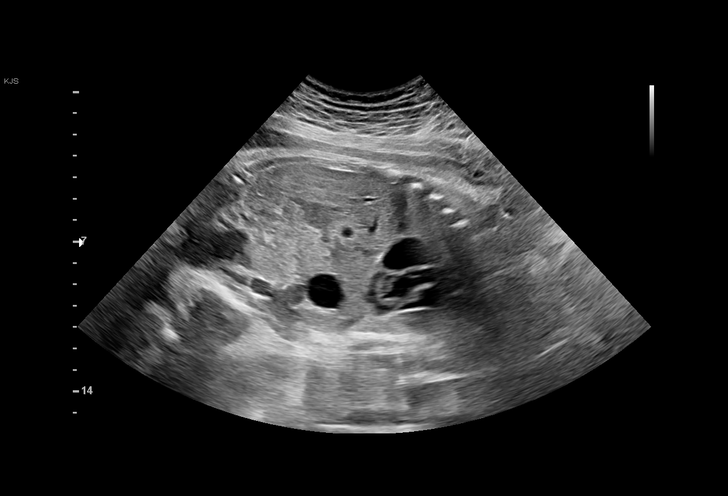
[im 10/51]
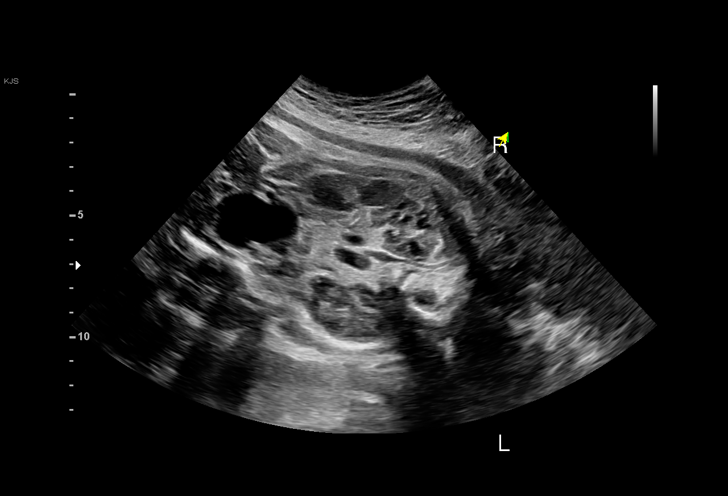
[im 13/51]
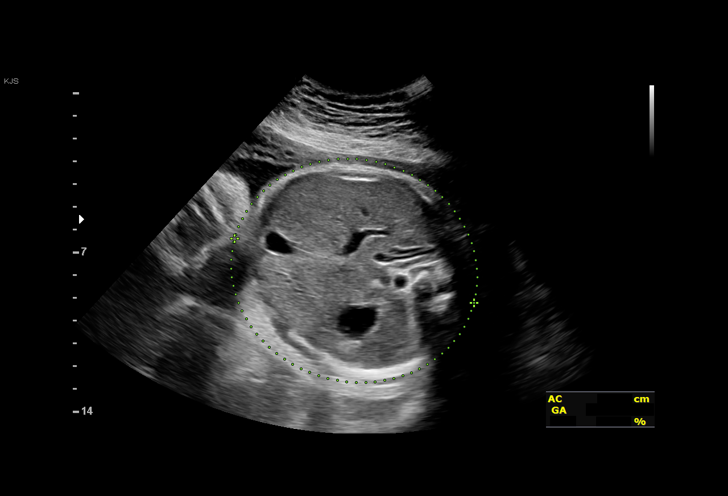
[im 17/51]
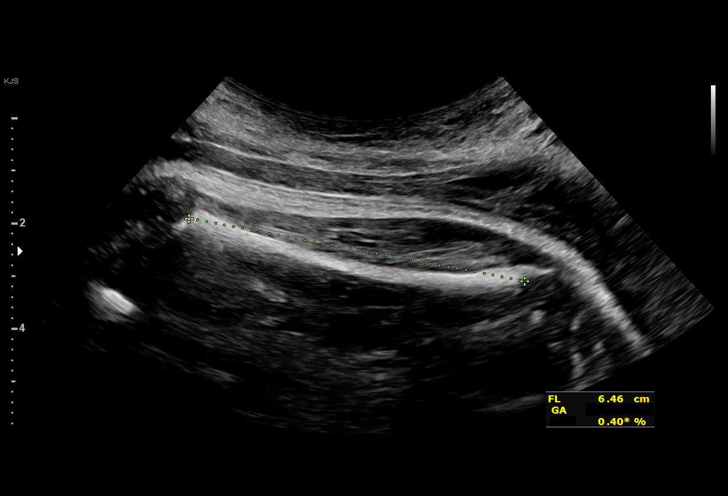
[im 21/51]
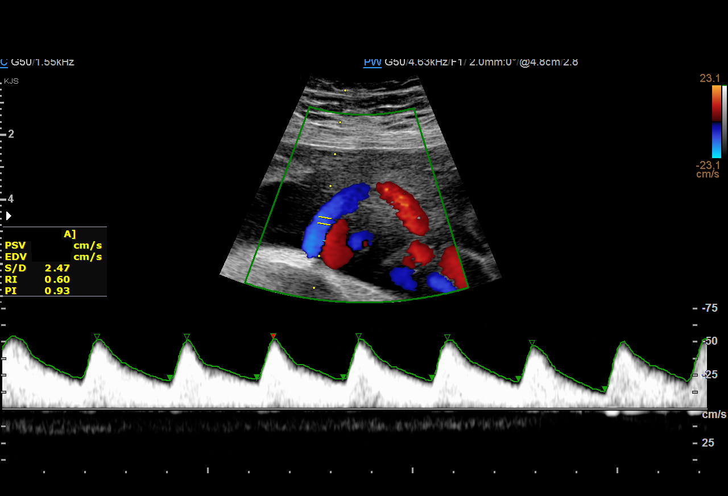
[im 26/51]
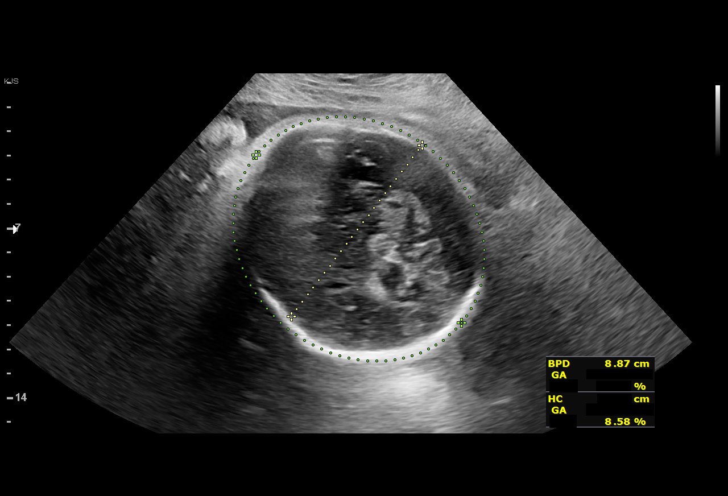
[im 30/51]
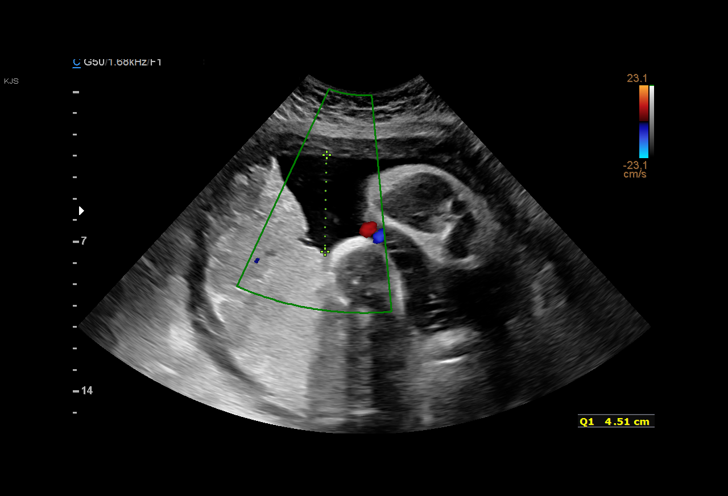
[im 34/51]
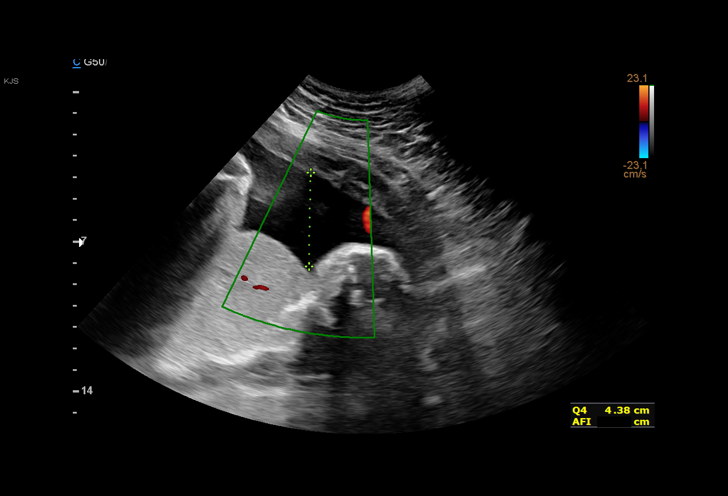
[im 38/51]
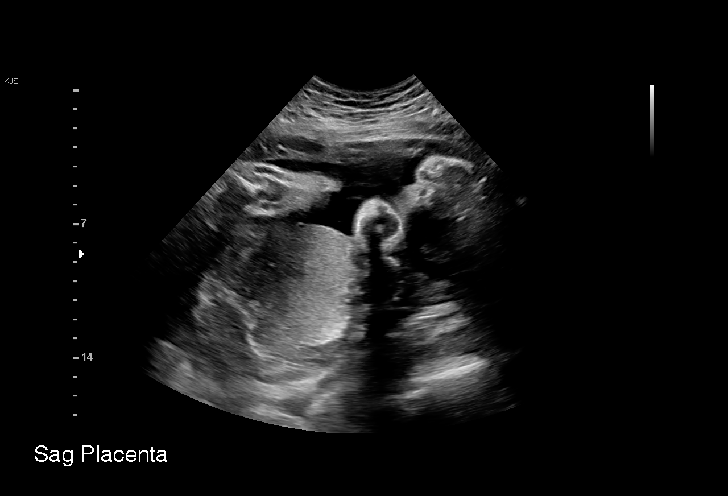
[im 41/51]
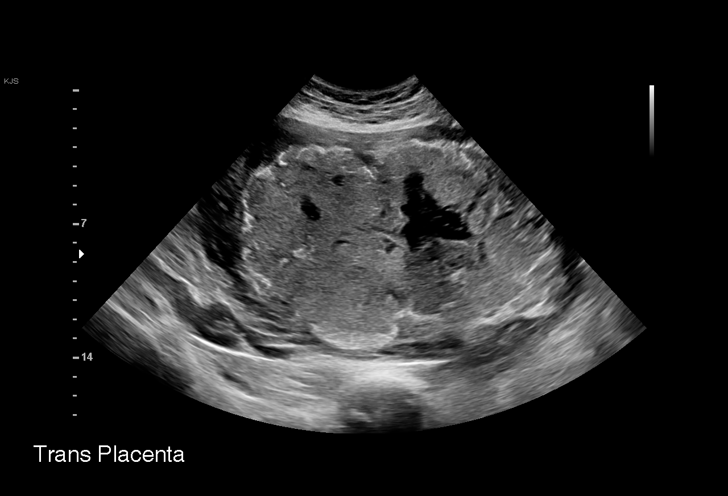
[im 45/51]
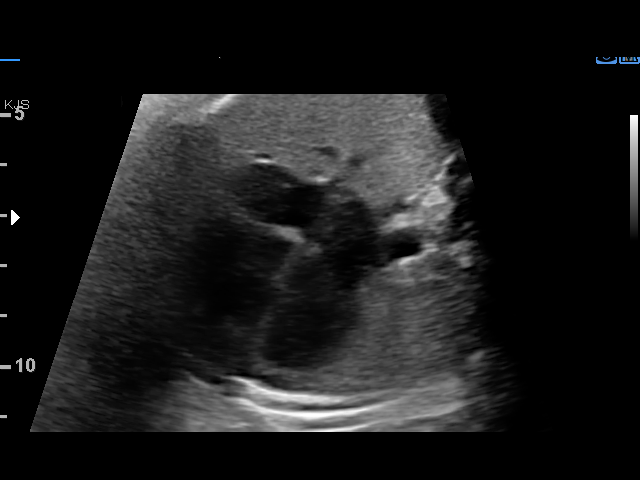
[im 49/51]
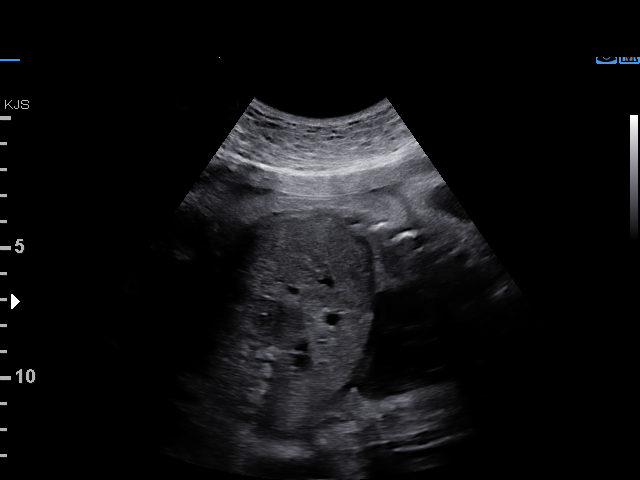

[13 of 28 positions shown; findings below may reference images not displayed]

Indications

 Maternal care for known or suspected poor
 fetal growth, third trimester, not applicable or
 unspecified IUGR
 Genetic carrier (cystic fibrosis)
 37 weeks gestation of pregnancy
 LR NIPS - Male, AFP Negative
Fetal Evaluation

 Num Of Fetuses:         1
 Fetal Heart Rate(bpm):  144
 Cardiac Activity:       Observed
 Presentation:           Cephalic
 Placenta:               Posterior
 P. Cord Insertion:      Previously Visualized

 Amniotic Fluid
 AFI FV:      Within normal limits

 AFI Sum(cm)     %Tile       Largest Pocket(cm)
 12.16           40

 RUQ(cm)       RLQ(cm)       LUQ(cm)        LLQ(cm)

Biophysical Evaluation

 Amniotic F.V:   Pocket => 2 cm             F. Tone:        Observed
 F. Movement:    Observed                   Score:          [DATE]
 F. Breathing:   Observed
Biometry

 BPD:      88.7  mm     G. Age:  35w 6d         31  %    CI:        75.83   %    70 - 86
                                                         FL/HC:      19.9   %    20.8 -
 HC:      322.9  mm     G. Age:  36w 3d         13  %    HC/AC:      1.00        0.92 -
 AC:      323.6  mm     G. Age:  36w 2d         39  %    FL/BPD:     72.5   %    71 - 87
 FL:       64.3  mm     G. Age:  33w 1d        < 1  %    FL/AC:      19.9   %    20 - 24

 Est. FW:    6020  gm    5 lb 15 oz      17  %
OB History

 Gravidity:    1         Term:   0        Prem:   0        SAB:   0
 TOP:          0       Ectopic:  0        Living: 0
Gestational Age

 LMP:           37w 1d        Date:  05/02/21                 EDD:   02/06/22
 U/S Today:     35w 3d                                        EDD:   02/18/22
 Best:          37w 1d     Det. By:  LMP  (05/02/21)          EDD:   02/06/22
Anatomy

 Cranium:               Appears normal         LVOT:                   Previously seen
 Cavum:                 Previously seen        Aortic Arch:            Previously seen
 Ventricles:            Previously seen        Ductal Arch:            Previously seen
 Choroid Plexus:        Previously seen        Diaphragm:              Appears normal
 Cerebellum:            Previously seen        Stomach:                Appears normal, left
                                                                       sided
 Posterior Fossa:       Previously seen        Abdomen:                Appears normal
 Nuchal Fold:           Not applicable (>20    Abdominal Wall:         Previously seen
                        wks GA)
 Face:                  Orbits and profile     Cord Vessels:           Previously seen
                        previously seen
 Lips:                  Appears normal         Kidneys:                Appear normal
 Palate:                Previously seen        Bladder:                Appears normal
 Thoracic:              Appears normal         Spine:                  Previously seen
 Heart:                 Appears normal         Upper Extremities:      Previously seen
                        (4CH, axis, and
                        situs)
 RVOT:                  Previously seen        Lower Extremities:      Previously seen

 Other:  Male gender previously seen. Nasal bone, lenses, 3VV and 3VTV,
         Heels/feet and open hands/5th digits previously visualized.
         Technically difficult due to advanced GA and fetal position.
Doppler - Fetal Vessels

 Umbilical Artery
  S/D     %tile      RI    %tile      PI    %tile     PSV    ADFV    RDFV
                                                    (cm/s)
  2.73       75    0.63       78    0.[REDACTED]      No      No
Cervix Uterus Adnexa

 Cervix
 Not visualized (advanced GA >35wks)
Comments

 This patient was seen for a follow up growth scan as a small
 for gestational age fetus was noted during her prior exams.
 She denies any problems since her last exam and reports
 feeling vigorous fetal movements throughout the day.
 On today's exam, the EFW measures at the 17th percentile
 for her gestational age.
 There was normal amniotic fluid noted.
 A biophysical profile performed today was [DATE].
 Doppler studies of the umbilical arteries showed a normal
 S/D ratio of 2.73.  There were no signs of absent or reversed
 end-diastolic flow.
 She will return in 1 week for another BPP.

 She already has an induction of labor scheduled on [DATE],

## 2023-05-13 ENCOUNTER — Encounter: Payer: Self-pay | Admitting: Family Medicine

## 2023-05-13 ENCOUNTER — Other Ambulatory Visit: Payer: Self-pay

## 2023-05-13 ENCOUNTER — Ambulatory Visit (INDEPENDENT_AMBULATORY_CARE_PROVIDER_SITE_OTHER): Payer: Medicaid Other | Admitting: Family Medicine

## 2023-05-13 VITALS — BP 113/69 | HR 73 | Ht 63.0 in | Wt 164.0 lb

## 2023-05-13 DIAGNOSIS — Z3169 Encounter for other general counseling and advice on procreation: Secondary | ICD-10-CM | POA: Diagnosis not present

## 2023-05-13 DIAGNOSIS — Z30432 Encounter for removal of intrauterine contraceptive device: Secondary | ICD-10-CM | POA: Diagnosis not present

## 2023-05-13 DIAGNOSIS — Z3009 Encounter for other general counseling and advice on contraception: Secondary | ICD-10-CM

## 2023-05-13 NOTE — Progress Notes (Signed)
    Contraception/Family Planning VISIT ENCOUNTER NOTE  Subjective:   Chloe Chen is a 27 y.o. G18P0000 female here for reproductive life counseling.  Planning pregnancy in the next year. Wants to start trying.    Gynecologic History No LMP recorded (lmp unknown). (Menstrual status: IUD). Contraception: IUD  Health Maintenance Due  Topic Date Due   PAP-Cervical Cytology Screening  Never done   PAP SMEAR-Modifier  Never done   INFLUENZA VACCINE  Never done   COVID-19 Vaccine (1 - 2023-24 season) Never done     The following portions of the patient's history were reviewed and updated as appropriate: allergies, current medications, past family history, past medical history, past social history, past surgical history and problem list.  Review of Systems Pertinent items are noted in HPI.   Objective:  BP 113/69   Pulse 73   Ht 5\' 3"  (1.6 m)   Wt 164 lb (74.4 kg)   LMP  (LMP Unknown)   Breastfeeding No   BMI 29.05 kg/m  Gen: well appearing, NAD HEENT: no scleral icterus CV: RR Lung: Normal WOB Ext: warm well perfused  PELVIC: Normal appearing external genitalia; normal appearing vaginal mucosa and cervix.  No abnormal discharge noted.   Normal uterine size, no other palpable masses, no uterine or adnexal tenderness.  IUD Removal  Patient identified, informed consent performed, consent signed.  Patient was in the dorsal lithotomy position, normal external genitalia was noted.  A speculum was placed in the patient's vagina, normal discharge was noted, no lesions. The cervix was visualized, no lesions, no abnormal discharge.  The strings of the IUD were grasped and pulled using ring forceps. The IUD was removed in its entirety. Patient tolerated the procedure well.    Patient plans for pregnancy soon and she was told to avoid teratogens, take PNV and folic acid.  Routine preventative health maintenance measures emphasized.   Assessment and Plan:   Contraception counseling:  Desires IUD removal and will try for pregnancy.  Encouraged starting PNV  Please refer to After Visit Summary for other counseling recommendations.   No follow-ups on file.  Federico Flake, MD, MPH, ABFM Attending Physician Faculty Practice- Center for [facility]

## 2023-07-12 ENCOUNTER — Encounter: Payer: Self-pay | Admitting: Family Medicine

## 2023-07-20 MED ORDER — PROMETHAZINE HCL 25 MG PO TABS: 25.0000 mg | ORAL_TABLET | Freq: Four times a day (QID) | ORAL | 1 refills | Status: DC | PRN

## 2023-07-20 MED ORDER — ONDANSETRON HCL 4 MG PO TABS: 4.0000 mg | ORAL_TABLET | Freq: Three times a day (TID) | ORAL | 0 refills | Status: DC | PRN

## 2023-07-27 NOTE — Telephone Encounter (Signed)
Please advise, note needed to return to work tomorrow due to early pregnancy symptoms. Patient has an upcoming appt with our office to confirm pregnancy

## 2023-08-03 ENCOUNTER — Ambulatory Visit (INDEPENDENT_AMBULATORY_CARE_PROVIDER_SITE_OTHER): Payer: Medicaid Other

## 2023-08-03 ENCOUNTER — Other Ambulatory Visit: Payer: Self-pay

## 2023-08-03 VITALS — BP 128/63 | HR 77 | Ht 63.0 in | Wt 158.0 lb

## 2023-08-03 DIAGNOSIS — Z3201 Encounter for pregnancy test, result positive: Secondary | ICD-10-CM | POA: Diagnosis not present

## 2023-08-03 LAB — POCT PREGNANCY, URINE: Preg Test, Ur: POSITIVE — AB

## 2023-08-03 MED ORDER — SCOPOLAMINE 1 MG/3DAYS TD PT72: 1.0000 | MEDICATED_PATCH | TRANSDERMAL | 12 refills | Status: DC

## 2023-08-03 NOTE — Addendum Note (Signed)
Addended by: Isabell Jarvis on: 08/03/2023 11:36 AM   Modules accepted: Orders

## 2023-08-03 NOTE — Progress Notes (Addendum)
Pt dropped off urine today for UPT. UPT is positive. Pt states took home UPT that were positive on Oct 29th, 2024. Patient states has lost weight in the last 2 weeks due to nausea and vomiting. Hx of Hyperemesis with first pregnancy.   Reviewed with Dr Nobie Putnam and he advised to send in Rx scopolamine patches to pharmacy. Rx sent.   Pt denies any vaginal bleeding, abd pain or cramps at this time.  Pt is current and returning [facility] patient.  Pt verbalized understanding and agreeable to plan of care.   LMP: 06/14/2023 EDC: 03/20/2024 100w1d   Jannel Lynne, RN

## 2023-08-04 ENCOUNTER — Other Ambulatory Visit: Payer: Self-pay | Admitting: Family Medicine

## 2023-08-04 ENCOUNTER — Emergency Department
Admission: EM | Admit: 2023-08-04 | Discharge: 2023-08-04 | Disposition: A | Discharge status: Home / Self Care | Payer: Medicaid Other | Attending: Emergency Medicine | Admitting: Emergency Medicine

## 2023-08-04 ENCOUNTER — Encounter: Payer: Self-pay | Admitting: Emergency Medicine

## 2023-08-04 ENCOUNTER — Other Ambulatory Visit: Payer: Self-pay

## 2023-08-04 ENCOUNTER — Emergency Department: Payer: Medicaid Other

## 2023-08-04 DIAGNOSIS — Z3A01 Less than 8 weeks gestation of pregnancy: Secondary | ICD-10-CM | POA: Insufficient documentation

## 2023-08-04 DIAGNOSIS — O210 Mild hyperemesis gravidarum: Secondary | ICD-10-CM | POA: Diagnosis not present

## 2023-08-04 DIAGNOSIS — O219 Vomiting of pregnancy, unspecified: Secondary | ICD-10-CM | POA: Diagnosis present

## 2023-08-04 DIAGNOSIS — O3680X0 Pregnancy with inconclusive fetal viability, not applicable or unspecified: Secondary | ICD-10-CM | POA: Diagnosis not present

## 2023-08-04 LAB — COMPREHENSIVE METABOLIC PANEL
ALT: 16 U/L (ref 0–44)
AST: 22 U/L (ref 15–41)
Albumin: 4.4 g/dL (ref 3.5–5.0)
Alkaline Phosphatase: 47 U/L (ref 38–126)
Anion gap: 12 (ref 5–15)
BUN: 9 mg/dL (ref 6–20)
CO2: 21 mmol/L — ABNORMAL LOW (ref 22–32)
Calcium: 9.1 mg/dL (ref 8.9–10.3)
Chloride: 100 mmol/L (ref 98–111)
Creatinine, Ser: 0.69 mg/dL (ref 0.44–1.00)
GFR, Estimated: >60 mL/min (ref >60)
Glucose, Bld: 87 mg/dL (ref 70–99)
Potassium: 3.6 mmol/L (ref 3.5–5.1)
Sodium: 133 mmol/L — ABNORMAL LOW (ref 135–145)
Total Bilirubin: 0.8 mg/dL (ref <1.2)
Total Protein: 7.8 g/dL (ref 6.5–8.1)

## 2023-08-04 LAB — CBC
HCT: 42.2 % (ref 36.0–46.0)
Hemoglobin: 14.5 g/dL (ref 12.0–15.0)
MCH: 29 pg (ref 26.0–34.0)
MCHC: 34.4 g/dL (ref 30.0–36.0)
MCV: 84.4 fL (ref 80.0–100.0)
Platelets: 323 10*3/uL (ref 150–400)
RBC: 5 MIL/uL (ref 3.87–5.11)
RDW: 12.6 % (ref 11.5–15.5)
WBC: 9.9 10*3/uL (ref 4.0–10.5)
nRBC: 0 % (ref 0.0–0.2)

## 2023-08-04 LAB — LIPASE, BLOOD: Lipase: 43 U/L (ref 11–51)

## 2023-08-04 LAB — HCG, QUANTITATIVE, PREGNANCY: hCG, Beta Chain, Quant, S: 191551 m[IU]/mL — ABNORMAL HIGH (ref <5)

## 2023-08-04 MED ORDER — PROMETHAZINE HCL 25 MG RE SUPP: 25.0000 mg | Freq: Four times a day (QID) | RECTAL | 0 refills | Status: DC | PRN

## 2023-08-04 MED ORDER — ONDANSETRON HCL 4 MG PO TABS: 4.0000 mg | ORAL_TABLET | Freq: Three times a day (TID) | ORAL | 0 refills | Status: DC | PRN

## 2023-08-04 MED ORDER — SODIUM CHLORIDE 0.9 % IV SOLN
12.5000 mg | Freq: Once | INTRAVENOUS | Status: AC
  Administered 2023-08-04: 12.5 mg via INTRAVENOUS
  Filled 2023-08-04: qty 12.5

## 2023-08-04 MED ORDER — DEXTROSE 5 % IN LACTATED RINGERS IV BOLUS
1000.0000 mL | Freq: Once | INTRAVENOUS | Status: AC
  Administered 2023-08-04: 1000 mL via INTRAVENOUS
  Filled 2023-08-04: qty 1000

## 2023-08-04 MED ORDER — METOCLOPRAMIDE HCL 5 MG/ML IJ SOLN
10.0000 mg | Freq: Once | INTRAMUSCULAR | Status: AC
  Administered 2023-08-04: 10 mg via INTRAVENOUS
  Filled 2023-08-04: qty 2

## 2023-08-04 NOTE — ED Notes (Signed)
Pt. Given gingerale with ice per request.

## 2023-08-04 NOTE — Discharge Instructions (Signed)
Please follow-up with your doctor regarding today's ER visit and your nausea/vomiting likely associated with your pregnancy.  Please take your nausea medication as needed, as prescribed.  Return to the emergency department for any worsening nausea/vomiting inability to keep down fluids or any other symptom concerning to yourself.

## 2023-08-04 NOTE — ED Notes (Signed)
Pt. To U/S

## 2023-08-04 NOTE — ED Triage Notes (Signed)
Pt via POV from home. Pt was seen here last week and dx with hyperemesis gravidarum. States medications that have been prescribed has not been helping. Pt is A&Ox4 and NAD

## 2023-08-04 NOTE — ED Provider Notes (Signed)
[facility] Provider Note    Event Date/Time   First MD Initiated Contact with Patient 08/04/23 1217     (approximate)   History   Emesis During Pregnancy   HPI  Chloe Chen is a 27 year old G2 presenting to the emergency department for evaluation of vomiting.  Recent positive urine pregnancy test, has not yet had an ultrasound of this pregnancy.  Prior pregnancy complicated by hyperemesis gravidarum that required admission.  Has been taking Zofran and Phenergan at home, but has not been able to keep anything down over the past several days.  No fevers or chills.  No significant abdominal pain.  LMP in early October.      Physical Exam   Triage Vital Signs: ED Triage Vitals  Encounter Vitals Group     BP 08/04/23 1201 127/80     Systolic BP Percentile --      Diastolic BP Percentile --      Pulse Rate 08/04/23 1201 66     Resp 08/04/23 1201 16     Temp 08/04/23 1201 99.1 F (37.3 C)     Temp Source 08/04/23 1201 Oral     SpO2 08/04/23 1201 93 %     Weight 08/04/23 1201 158 lb (71.7 kg)     Height 08/04/23 1201 5\' 3"  (1.6 m)     Head Circumference --      Peak Flow --      Pain Score 08/04/23 1203 0     Pain Loc --      Pain Education --      Exclude from Growth Chart --     Most recent vital signs: Vitals:   08/04/23 1201  BP: 127/80  Pulse: 66  Resp: 16  Temp: 99.1 F (37.3 C)  SpO2: 93%     General: Awake, interactive  CV:  Regular rate, good peripheral perfusion.  Resp:  Unlabored respirations, lungs clear to auscultation Abd:  Nondistended, soft, nontender Neuro:  Symmetric facial movement, fluid speech   ED Results / Procedures / Treatments   Labs (all labs ordered are listed, but only abnormal results are displayed) Labs Reviewed  COMPREHENSIVE METABOLIC PANEL - Abnormal; Notable for the following components:      Result Value   Sodium 133 (*)    CO2 21 (*)    All other components within normal limits  HCG,  QUANTITATIVE, PREGNANCY - Abnormal; Notable for the following components:   hCG, Beta Chain, Quant, S 191,551 (*)    All other components within normal limits  LIPASE, BLOOD  CBC  URINALYSIS, ROUTINE W REFLEX MICROSCOPIC     EKG EKG independently reviewed interpreted by myself (ER attending) demonstrates:    RADIOLOGY Imaging independently reviewed and interpreted by myself demonstrates:    PROCEDURES:  Critical Care performed: No  Procedures   MEDICATIONS ORDERED IN ED: Medications  dextrose 5% lactated ringers bolus 1,000 mL (0 mLs Intravenous Stopped 08/04/23 1441)  promethazine (PHENERGAN) 12.5 mg in sodium chloride 0.9 % 50 mL IVPB (0 mg Intravenous Stopped 08/04/23 1400)     IMPRESSION / MDM / ASSESSMENT AND PLAN / ED COURSE  I reviewed the triage vital signs and the nursing notes.  Differential diagnosis includes, but is not limited to, vomiting in pregnancy, hyperemesis, viral GI illness  Patient's presentation is most consistent with acute presentation with potential threat to life or bodily function.  27 year old female presenting to the emergency department for evaluation of vomiting in the setting  of recent positive pregnancy test.  Has trialed multiple antiemetics at home.  Will provide IV fluids and IV Phenergan here.  Ultrasound ordered to confirm location of pregnancy.  Labs with mild acidosis, otherwise reassuring.  Urinalysis pending.  Signed out to oncoming physician pending ultrasound, p.o. challenge, and disposition.  If patient remains unable to tolerate p.o. after antiemetics here, may require admission.     FINAL CLINICAL IMPRESSION(S) / ED DIAGNOSES   Final diagnoses:  Hyperemesis gravidarum  Pregnancy of unknown anatomic location     Rx / DC Orders   ED Discharge Orders     None        Note:  This document was prepared using Dragon voice recognition software and may include unintentional dictation errors.   Trinna Post,  MD 08/04/23 [phone]

## 2023-08-04 NOTE — ED Provider Notes (Signed)
-----------------------------------------   4:56 PM on 08/04/2023 ----------------------------------------- Patient states she is feeling better after the fluids and medication.  Ultrasound shows 7-week 6-day pregnancy.  Lab work overall reassuring.  Patient states she has Zofran ODT as well as Phenergan tablets at home.  States she has used Phenergan suppositories in the past but does not have any currently.  I discussed when to use these medications.  We will prescribe the patient Phenergan suppositories.  She will follow-up with her doctor.  Patient agreeable to plan of care.   Minna Antis, MD 08/04/23 [phone]

## 2023-08-11 ENCOUNTER — Emergency Department
Admission: EM | Admit: 2023-08-11 | Discharge: 2023-08-11 | Disposition: A | Discharge status: Home / Self Care | Payer: Medicaid Other | Attending: Emergency Medicine | Admitting: Emergency Medicine

## 2023-08-11 ENCOUNTER — Other Ambulatory Visit: Payer: Self-pay

## 2023-08-11 ENCOUNTER — Encounter: Payer: Self-pay | Admitting: Emergency Medicine

## 2023-08-11 DIAGNOSIS — Z3A09 9 weeks gestation of pregnancy: Secondary | ICD-10-CM | POA: Diagnosis not present

## 2023-08-11 DIAGNOSIS — O210 Mild hyperemesis gravidarum: Secondary | ICD-10-CM | POA: Insufficient documentation

## 2023-08-11 DIAGNOSIS — O219 Vomiting of pregnancy, unspecified: Secondary | ICD-10-CM | POA: Diagnosis present

## 2023-08-11 LAB — COMPREHENSIVE METABOLIC PANEL
ALT: 20 U/L (ref 0–44)
AST: 34 U/L (ref 15–41)
Albumin: 4.7 g/dL (ref 3.5–5.0)
Alkaline Phosphatase: 48 U/L (ref 38–126)
Anion gap: 9 (ref 5–15)
BUN: 10 mg/dL (ref 6–20)
CO2: 23 mmol/L (ref 22–32)
Calcium: 9.3 mg/dL (ref 8.9–10.3)
Chloride: 100 mmol/L (ref 98–111)
Creatinine, Ser: 0.67 mg/dL (ref 0.44–1.00)
GFR, Estimated: >60 mL/min (ref >60)
Glucose, Bld: 135 mg/dL — ABNORMAL HIGH (ref 70–99)
Potassium: 3.5 mmol/L (ref 3.5–5.1)
Sodium: 132 mmol/L — ABNORMAL LOW (ref 135–145)
Total Bilirubin: 0.8 mg/dL (ref <1.2)
Total Protein: 8.4 g/dL — ABNORMAL HIGH (ref 6.5–8.1)

## 2023-08-11 LAB — CBC
HCT: 44.5 % (ref 36.0–46.0)
Hemoglobin: 15 g/dL (ref 12.0–15.0)
MCH: 29.1 pg (ref 26.0–34.0)
MCHC: 33.7 g/dL (ref 30.0–36.0)
MCV: 86.4 fL (ref 80.0–100.0)
Platelets: 314 10*3/uL (ref 150–400)
RBC: 5.15 MIL/uL — ABNORMAL HIGH (ref 3.87–5.11)
RDW: 12.3 % (ref 11.5–15.5)
WBC: 8.2 10*3/uL (ref 4.0–10.5)
nRBC: 0 % (ref 0.0–0.2)

## 2023-08-11 LAB — URINALYSIS, ROUTINE W REFLEX MICROSCOPIC
Bilirubin Urine: NEGATIVE
Glucose, UA: 50 mg/dL — AB
Hgb urine dipstick: NEGATIVE
Ketones, ur: 20 mg/dL — AB
Nitrite: NEGATIVE
Protein, ur: 30 mg/dL — AB
Specific Gravity, Urine: 1.033 — ABNORMAL HIGH (ref 1.005–1.030)
WBC, UA: >50 WBC/hpf (ref 0–5)
pH: 5 (ref 5.0–8.0)

## 2023-08-11 LAB — LIPASE, BLOOD: Lipase: 57 U/L — ABNORMAL HIGH (ref 11–51)

## 2023-08-11 LAB — POC URINE PREG, ED: Preg Test, Ur: NEGATIVE

## 2023-08-11 MED ORDER — SODIUM CHLORIDE 0.9 % IV SOLN: Freq: Once | INTRAVENOUS | Status: AC

## 2023-08-11 MED ORDER — SODIUM CHLORIDE 0.9 % IV SOLN
25.0000 mg | Freq: Four times a day (QID) | INTRAVENOUS | Status: DC | PRN
  Administered 2023-08-11: 25 mg via INTRAVENOUS
  Filled 2023-08-11: qty 1

## 2023-08-11 NOTE — ED Triage Notes (Signed)
Pt sts that she is [redacted]wks pregnant and has hyperemesis gravidarum. Pt sts that her OB advised her that she would need to come in and get IV fluids regularly.

## 2023-08-11 NOTE — ED Provider Notes (Signed)
[facility] Provider Note    Event Date/Time   First MD Initiated Contact with Patient 08/11/23 1123     (approximate)   History   Emesis During Pregnancy   HPI  Chloe Chen is a 27 y.o. female with a history of hyperemesis gravidarum presents with complaints of nausea and vomiting.  No vaginal bleeding.  No abdominal pain.  This is her second pregnancy, she had the same symptoms during her first pregnancy.  She was last seen in late November per review of records for nausea vomiting, improved with 2 L IV fluid and IV Phenergan     Physical Exam   Triage Vital Signs: ED Triage Vitals [08/11/23 1115]  Encounter Vitals Group     BP 129/81     Systolic BP Percentile      Diastolic BP Percentile      Pulse Rate 83     Resp 17     Temp 98.7 F (37.1 C)     Temp Source Oral     SpO2 100 %     Weight 68 kg (150 lb)     Height 1.6 m (5\' 3" )     Head Circumference      Peak Flow      Pain Score 0     Pain Loc      Pain Education      Exclude from Growth Chart     Most recent vital signs: Vitals:   08/11/23 1115  BP: 129/81  Pulse: 83  Resp: 17  Temp: 98.7 F (37.1 C)  SpO2: 100%     General: Awake, no distress.  CV:  Good peripheral perfusion.  Resp:  Normal effort.  Abd:  No distention.  Other:     ED Results / Procedures / Treatments   Labs (all labs ordered are listed, but only abnormal results are displayed) Labs Reviewed  LIPASE, BLOOD - Abnormal; Notable for the following components:      Result Value   Lipase 57 (*)    All other components within normal limits  COMPREHENSIVE METABOLIC PANEL - Abnormal; Notable for the following components:   Sodium 132 (*)    Glucose, Bld 135 (*)    Total Protein 8.4 (*)    All other components within normal limits  CBC - Abnormal; Notable for the following components:   RBC 5.15 (*)    All other components within normal limits  URINALYSIS, ROUTINE W REFLEX MICROSCOPIC - Abnormal;  Notable for the following components:   Color, Urine AMBER (*)    APPearance CLOUDY (*)    Specific Gravity, Urine 1.033 (*)    Glucose, UA 50 (*)    Ketones, ur 20 (*)    Protein, ur 30 (*)    Leukocytes,Ua MODERATE (*)    Bacteria, UA MANY (*)    All other components within normal limits  POC URINE PREG, ED     EKG     RADIOLOGY     PROCEDURES:  Critical Care performed:   Procedures   MEDICATIONS ORDERED IN ED: Medications  promethazine (PHENERGAN) 25 mg in sodium chloride 0.9 % 50 mL IVPB (0 mg Intravenous Stopped 08/11/23 1422)  0.9 %  sodium chloride infusion ( Intravenous New Bag/Given 08/11/23 1212)  0.9 %  sodium chloride infusion ( Intravenous New Bag/Given 08/11/23 1212)     IMPRESSION / MDM / ASSESSMENT AND PLAN / ED COURSE  I reviewed the triage vital signs and the  nursing notes. Patient's presentation is most consistent with exacerbation of chronic illness.  Patient presents with nausea and vomiting as detailed above, she reports she has lost 8 pounds over the last week or so.  Denies abdominal pain, no vaginal bleeding  She is about 9 weeks of gestation  Will treat with 2 L IV fluids, IV Phenergan  Lab work reviewed overall reassuring, no significant electrolyte abnormalities besides mild hyponatremia  ----------------------------------------- 2:30 PM on 08/11/2023 ----------------------------------------- After fluids and Phenergan, patient feeling much better      FINAL CLINICAL IMPRESSION(S) / ED DIAGNOSES   Final diagnoses:  Hyperemesis gravidarum     Rx / DC Orders   ED Discharge Orders     None        Note:  This document was prepared using Dragon voice recognition software and may include unintentional dictation errors.   Jene Every, MD 08/11/23 1430

## 2023-08-12 ENCOUNTER — Other Ambulatory Visit: Payer: Self-pay | Admitting: Family Medicine

## 2023-08-12 ENCOUNTER — Encounter: Payer: Self-pay | Admitting: Family Medicine

## 2023-08-12 DIAGNOSIS — O210 Mild hyperemesis gravidarum: Secondary | ICD-10-CM | POA: Insufficient documentation

## 2023-08-12 NOTE — Progress Notes (Signed)
Infusion center orders for hyperemesis

## 2023-08-13 ENCOUNTER — Other Ambulatory Visit: Payer: Self-pay | Admitting: Family Medicine

## 2023-08-13 NOTE — Progress Notes (Signed)
Orders for infusions

## 2023-08-20 ENCOUNTER — Inpatient Hospital Stay (HOSPITAL_COMMUNITY): Admission: RE | Admit: 2023-08-20 | Payer: Medicaid Other | Source: Ambulatory Visit

## 2023-08-24 ENCOUNTER — Other Ambulatory Visit: Payer: Self-pay

## 2023-08-24 MED ORDER — ONDANSETRON HCL 4 MG PO TABS: 4.0000 mg | ORAL_TABLET | Freq: Three times a day (TID) | ORAL | 0 refills | Status: DC | PRN

## 2023-08-25 ENCOUNTER — Encounter: Payer: Medicaid Other | Admitting: Family Medicine

## 2023-08-27 ENCOUNTER — Encounter (HOSPITAL_COMMUNITY): Payer: Medicaid Other

## 2023-08-27 ENCOUNTER — Encounter (HOSPITAL_COMMUNITY)
Admission: RE | Admit: 2023-08-27 | Discharge: 2023-08-27 | Disposition: A | Discharge status: Home / Self Care | Payer: Medicaid Other | Source: Ambulatory Visit | Attending: Family Medicine | Admitting: Family Medicine

## 2023-08-27 DIAGNOSIS — Z3A00 Weeks of gestation of pregnancy not specified: Secondary | ICD-10-CM | POA: Diagnosis not present

## 2023-08-27 DIAGNOSIS — O210 Mild hyperemesis gravidarum: Secondary | ICD-10-CM | POA: Diagnosis present

## 2023-08-27 MED ORDER — SODIUM CHLORIDE 0.9 % IV SOLN
8.0000 mg | INTRAVENOUS | Status: DC
  Administered 2023-08-27: 8 mg via INTRAVENOUS
  Filled 2023-08-27: qty 4

## 2023-08-27 MED ORDER — SODIUM CHLORIDE 0.9 % IV SOLN
25.0000 mg | INTRAVENOUS | Status: DC
  Administered 2023-08-27: 25 mg via INTRAVENOUS
  Filled 2023-08-27: qty 1

## 2023-08-27 MED ORDER — SCOPOLAMINE 1 MG/3DAYS TD PT72
1.0000 | MEDICATED_PATCH | TRANSDERMAL | Status: DC
  Administered 2023-08-27: 1.5 mg via TRANSDERMAL
  Filled 2023-08-27: qty 1

## 2023-09-04 ENCOUNTER — Encounter (HOSPITAL_COMMUNITY): Payer: Medicaid Other

## 2023-09-09 NOTE — L&D Delivery Note (Signed)
 [facility]N [facility] Delivery Note  Arturo Freundlich is a 28 y.o. G2P1001 s/p SVD at [redacted]w[redacted]d. She was admitted for IOL for large fetal renal cyst (drained 6/17).   ROM: 6h 13m with clear fluid GBS Status: negative Maximum Maternal Temperature: 98.3  Labor Progress: Patient arrived for IOL and was given pitocin .  Patient then had AROM and progressed to complete with no complications.   Delivery Date/Time: 02/24/24 0515 Delivery: Called to room and patient was complete and pushing. Head delivered ROA. Nuchal cord present and easily reduced. Shoulder and body delivered in usual fashion. Infant with spontaneous cry, placed on mother's abdomen, dried and stimulated. Cord clamped x 2 after 1-minute delay, and cut by [name]. Cord blood drawn. Placenta delivered spontaneously, intact, with 3-vessel cord. Fundus firm with massage and Pitocin . Labia, perineum, vagina, and cervix inspected, no laceration found  Placenta: 3-vessel cord, intact Complications: none Lacerations: none EBL: 879 Analgesia: epidural  Postpartum Planning [X]  transfer orders to MB [ ]  discharge summary started & shared [ ]  message to sent to schedule follow-up  [X]  lists updated  Infant: baby boy noah  APGARs 8 and 9  weight pending  Andrez Keel MD [facility] [place] Resident PGY-1  Center for [facility], [facility] 02/24/24 5:42 AM

## 2023-09-10 ENCOUNTER — Encounter: Payer: Self-pay | Admitting: Family Medicine

## 2023-09-10 ENCOUNTER — Other Ambulatory Visit (HOSPITAL_COMMUNITY)
Admission: RE | Admit: 2023-09-10 | Discharge: 2023-09-10 | Disposition: A | Discharge status: Home / Self Care | Payer: Medicaid Other | Source: Ambulatory Visit | Attending: Family Medicine | Admitting: Family Medicine

## 2023-09-10 ENCOUNTER — Other Ambulatory Visit: Payer: Self-pay

## 2023-09-10 ENCOUNTER — Ambulatory Visit: Payer: Medicaid Other | Admitting: Family Medicine

## 2023-09-10 ENCOUNTER — Encounter (HOSPITAL_COMMUNITY): Payer: Medicaid Other

## 2023-09-10 VITALS — BP 96/61 | HR 66 | Wt 162.2 lb

## 2023-09-10 DIAGNOSIS — O0990 Supervision of high risk pregnancy, unspecified, unspecified trimester: Secondary | ICD-10-CM | POA: Insufficient documentation

## 2023-09-10 DIAGNOSIS — Z3A12 12 weeks gestation of pregnancy: Secondary | ICD-10-CM

## 2023-09-10 DIAGNOSIS — Z3481 Encounter for supervision of other normal pregnancy, first trimester: Secondary | ICD-10-CM | POA: Insufficient documentation

## 2023-09-10 DIAGNOSIS — O0991 Supervision of high risk pregnancy, unspecified, first trimester: Secondary | ICD-10-CM

## 2023-09-10 NOTE — Progress Notes (Signed)
 Subjective:   Chloe Chen is a 28 y.o. G2P1001 at [redacted]w[redacted]d by LMP being seen today for her first obstetrical visit.  Her obstetrical history is significant for intrauterine growth restriction (IUGR). Patient does intend to breast feed. Pregnancy history fully reviewed.  Patient reports no bleeding, no contractions, no cramping, and no leaking.  HISTORY: OB History  Gravida Para Term Preterm AB Living  2 1 1  0 0 1  SAB IAB Ectopic Multiple Live Births  0 0 0 0 1    # Outcome Date GA Lbr Len/2nd Weight Sex Type Anes PTL Lv  2 Current           1 Term 01/28/22 [redacted]w[redacted]d  6 lb 11.6 oz (3.05 kg) M Vag-Spont   LIV     Name: Gerrit   Pap collected today.  Past Medical History:  Diagnosis Date   Medical history non-contributory    Past Surgical History:  Procedure Laterality Date   KNEE SURGERY  02/2019   KNEE SURGERY  06/2019   WISDOM TOOTH EXTRACTION     Family History  Problem Relation Age of Onset   Diabetes Neg Hx    Hypertension Neg Hx    Cancer Neg Hx    Social History   Tobacco Use   Smoking status: Never   Smokeless tobacco: Never  Vaping Use   Vaping status: Never Used  Substance Use Topics   Alcohol use: Not Currently   Drug use: Never   No Known Allergies Current Outpatient Medications on File Prior to Visit  Medication Sig Dispense Refill   ondansetron  (ZOFRAN ) 4 MG tablet Take 1 tablet (4 mg total) by mouth every 8 (eight) hours as needed for nausea or vomiting. 20 tablet 0   Prenatal Vit-Fe Fumarate-FA (MULTIVITAMIN-PRENATAL) 27-0.8 MG TABS tablet Take 1 tablet by mouth daily at 12 noon.     scopolamine  (TRANSDERM-SCOP) 1 MG/3DAYS Place 1 patch (1.5 mg total) onto the skin every 3 (three) days. 10 patch 12   promethazine  (PHENERGAN ) 25 MG suppository Place 1 suppository (25 mg total) rectally every 6 (six) hours as needed for nausea or vomiting. (Patient not taking: Reported on 09/10/2023) 12 each 0   promethazine  (PHENERGAN ) 25 MG tablet Take 1 tablet (25 mg  total) by mouth every 6 (six) hours as needed for nausea or vomiting. (Patient not taking: Reported on 09/10/2023) 30 tablet 1   No current facility-administered medications on file prior to visit.     Exam   Vitals:   09/10/23 1438  BP: 96/61  Pulse: 66  Weight: 162 lb 3.2 oz (73.6 kg)   Fetal Heart Rate (bpm): 154  Uterus:     Pelvic Exam: Perineum: no hemorrhoids, normal perineum   Vulva: normal external genitalia, no lesions   Vagina:  normal mucosa, normal discharge   Cervix: Cervical ectopy present, pap collected today    Adnexa: normal adnexa and no mass, fullness, tenderness   Bony Pelvis: average  System: General: well-developed, well-nourished female in no acute distress   Breast:  normal appearance, no masses or tenderness   Skin: normal coloration and turgor, no rashes   Neurologic: oriented, normal, negative, normal mood   Extremities: normal strength, tone, and muscle mass, ROM of all joints is normal   HEENT PERRLA, extraocular movement intact and sclera clear, anicteric   Mouth/Teeth mucous membranes moist, pharynx normal without lesions and dental hygiene good   Neck supple and no masses   Cardiovascular: regular rate and  rhythm   Respiratory:  no respiratory distress, normal breath sounds   Abdomen: soft, non-tender; bowel sounds normal; no masses,  no organomegaly     Assessment:   Pregnancy: G2P1001 Patient Active Problem List   Diagnosis Date Noted   Supervision of high risk pregnancy, antepartum, first trimester 09/10/2023   Encounter for supervision of other normal pregnancy, first trimester 09/10/2023   Hyperemesis gravidarum 08/12/2023   Anxiety 08/26/2022   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 Supervision of high risk pregnancy, antepartum, first trimester (Primary) FHR and BP appropriate today Continue routine prenatal care  2. Encounter for supervision of other normal pregnancy, first trimester Pap collected today -  CBC/D/Plt+RPR+Rh+ABO+RubIgG... - Hemoglobin A1c - Culture, OB Urine - Cytology - PAP( [place]) - [facility] Basic Panel - PANORAMA PRENATAL TEST - US  MFM OB DETAIL +14 WK; Future   Initial labs drawn. Continue prenatal vitamins. Genetic Screening discussed, First trimester screen and NIPS: ordered. Ultrasound discussed; fetal anatomic survey: ordered. Problem list reviewed and updated. The nature of [facility] with multiple MDs and other Advanced Practice Providers was explained to patient; also emphasized that residents, students are part of our team. Routine obstetric precautions reviewed. No follow-ups on file.

## 2023-09-11 LAB — CBC/D/PLT+RPR+RH+ABO+RUBIGG...
Antibody Screen: NEGATIVE
Basophils Absolute: 0 10*3/uL (ref 0.0–0.2)
Basos: 0 %
EOS (ABSOLUTE): 0 10*3/uL (ref 0.0–0.4)
Eos: 0 %
HCV Ab: NONREACTIVE
HIV Screen 4th Generation wRfx: NONREACTIVE
Hematocrit: 37.3 % (ref 34.0–46.6)
Hemoglobin: 12.4 g/dL (ref 11.1–15.9)
Hepatitis B Surface Ag: NEGATIVE
Immature Grans (Abs): 0 10*3/uL (ref 0.0–0.1)
Immature Granulocytes: 0 %
Lymphocytes Absolute: 2.7 10*3/uL (ref 0.7–3.1)
Lymphs: 24 %
MCH: 29.3 pg (ref 26.6–33.0)
MCHC: 33.2 g/dL (ref 31.5–35.7)
MCV: 88 fL (ref 79–97)
Monocytes Absolute: 0.6 10*3/uL (ref 0.1–0.9)
Monocytes: 5 %
Neutrophils Absolute: 8.1 10*3/uL — ABNORMAL HIGH (ref 1.4–7.0)
Neutrophils: 71 %
Platelets: 289 10*3/uL (ref 150–450)
RBC: 4.23 x10E6/uL (ref 3.77–5.28)
RDW: 13.8 % (ref 11.7–15.4)
RPR Ser Ql: NONREACTIVE
Rh Factor: NEGATIVE
Rubella Antibodies, IGG: 8.53 {index} (ref >0.99)
WBC: 11.6 10*3/uL — ABNORMAL HIGH (ref 3.4–10.8)

## 2023-09-11 LAB — HEMOGLOBIN A1C
Est. average glucose Bld gHb Est-mCnc: 105 mg/dL
Hgb A1c MFr Bld: 5.3 % (ref 4.8–5.6)

## 2023-09-11 LAB — HCV INTERPRETATION

## 2023-09-13 LAB — URINE CULTURE, OB REFLEX

## 2023-09-13 LAB — CULTURE, OB URINE

## 2023-09-15 LAB — CYTOLOGY - PAP
Chlamydia: NEGATIVE
Comment: NEGATIVE
Comment: NEGATIVE
Comment: NORMAL
Diagnosis: NEGATIVE
Neisseria Gonorrhea: NEGATIVE
Trichomonas: NEGATIVE

## 2023-09-16 LAB — PANORAMA PRENATAL TEST FULL PANEL:PANORAMA TEST PLUS 5 ADDITIONAL MICRODELETIONS: FETAL FRACTION: 9.7

## 2023-09-18 LAB — HORIZON CUSTOM: REPORT SUMMARY: NEGATIVE

## 2023-09-28 ENCOUNTER — Ambulatory Visit
Admission: RE | Admit: 2023-09-28 | Discharge: 2023-09-28 | Disposition: A | Discharge status: Home / Self Care | Payer: Medicaid Other | Source: Ambulatory Visit | Attending: Emergency Medicine | Admitting: Emergency Medicine

## 2023-09-28 VITALS — BP 123/80 | HR 74 | Temp 98.0°F | Resp 18

## 2023-09-28 DIAGNOSIS — H6692 Otitis media, unspecified, left ear: Secondary | ICD-10-CM | POA: Diagnosis not present

## 2023-09-28 DIAGNOSIS — Z3A15 15 weeks gestation of pregnancy: Secondary | ICD-10-CM | POA: Diagnosis not present

## 2023-09-28 MED ORDER — AMOXICILLIN 875 MG PO TABS: 875.0000 mg | ORAL_TABLET | Freq: Two times a day (BID) | ORAL | 0 refills | Status: AC

## 2023-09-28 NOTE — Discharge Instructions (Addendum)
Take the amoxicillin as directed.  Follow up with your primary care provider if your symptoms are not improving.   ° ° °

## 2023-09-28 NOTE — ED Triage Notes (Signed)
Patient to Urgent Care with complaints of "low grade" fevers and left ear pain.  Reports symptoms started yesterday. Used warm compress/ hydrogen peroxide/ steam.  [redacted]w[redacted]d pregnant.

## 2023-09-28 NOTE — ED Provider Notes (Signed)
Chloe Chen    CSN: [id number] Arrival date & time: 09/28/23  1602      History   Chief Complaint Chief Complaint  Patient presents with   Ear Drainage    Pregnant with ear infection, running low grade fever - Entered by patient    HPI Chloe Chen is a 28 y.o. female.  Patient is [redacted] weeks pregnant.  She presents with left ear pain x 1 day.  She reports fever during the night but none today.  No OTC medications taken today.  No sore throat, cough, shortness of breath, abdominal pain, vomiting, diarrhea.  The history is provided by the patient and medical records.    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 Authorizing Provider  amoxicillin (AMOXIL) 875 MG tablet Take 1 tablet (875 mg total) by mouth 2 (two) times daily for 10 days. 09/28/23 10/08/23 Yes Mickie Bail, NP  ondansetron (ZOFRAN) 4 MG tablet Take 1 tablet (4 mg total) by mouth every 8 (eight) hours as needed for nausea or vomiting. 08/24/23   Celedonio Savage, MD  Prenatal Vit-Fe Fumarate-FA (MULTIVITAMIN-PRENATAL) 27-0.8 MG TABS tablet Take 1 tablet by mouth daily at 12 noon.    [provider]  promethazine (PHENERGAN) 25 MG suppository Place 1  suppository (25 mg total) rectally every 6 (six) hours as needed for nausea or vomiting. Patient not taking: Reported on 09/10/2023 08/04/23   Minna Antis, MD  promethazine (PHENERGAN) 25 MG tablet Take 1 tablet (25 mg total) by mouth every 6 (six) hours as needed for nausea or vomiting. Patient not taking: Reported on 09/10/2023 07/20/23   Venora Maples, MD  scopolamine (TRANSDERM-SCOP) 1 MG/3DAYS Place 1 patch (1.5 mg total) onto the skin every 3 (three) days. 08/03/23   Celedonio Savage, MD    Family History Family History  Problem Relation Age of Onset   Diabetes Neg Hx    Hypertension Neg Hx    Cancer Neg Hx     Social History Social History   Tobacco Use   Smoking status: Never   Smokeless tobacco: Never  Vaping Use   Vaping status: Never Used  Substance Use Topics   Alcohol use: Not Currently   Drug use: Never     Allergies   Patient has no known allergies.   Review of Systems Review of Systems  Constitutional:  Positive for fever. Negative for chills.  HENT:  Positive for ear pain.  Negative for sore throat.   Respiratory:  Negative for cough and shortness of breath.   Gastrointestinal:  Negative for abdominal pain, diarrhea and vomiting.     Physical Exam Triage Vital Signs ED Triage Vitals  Encounter Vitals Group     BP 09/28/23 1613 123/80     Systolic BP Percentile --      Diastolic BP Percentile --      Pulse Rate 09/28/23 1613 74     Resp 09/28/23 1613 18     Temp 09/28/23 1613 98 F (36.7 C)     Temp src --      SpO2 09/28/23 1613 98 %     Weight --      Height --      Head Circumference --      Peak Flow --      Pain Score 09/28/23 1614 7     Pain Loc --      Pain Education --      Exclude from Growth Chart --    No data found.  Updated Vital Signs BP 123/80   Pulse 74   Temp 98 F (36.7 C)   Resp 18   LMP 06/14/2023 (Exact Date)   SpO2 98%   Visual Acuity Right Eye Distance:   Left Eye Distance:   Bilateral  Distance:    Right Eye Near:   Left Eye Near:    Bilateral Near:     Physical Exam Constitutional:      General: She is not in acute distress. HENT:     Right Ear: Tympanic membrane is erythematous.     Left Ear: Tympanic membrane is erythematous.     Ears:     Comments: L>R.  Left TM is brightly erythematous.  Right TM mildly erythematous. Canals clear.     Nose: Nose normal.     Mouth/Throat:     Mouth: Mucous membranes are moist.     Pharynx: Oropharynx is clear.  Cardiovascular:     Rate and Rhythm: Normal rate and regular rhythm.     Heart sounds: Normal heart sounds.  Pulmonary:     Effort: Pulmonary effort is normal. No respiratory distress.     Breath sounds: Normal breath sounds.  Neurological:     Mental Status: She is alert.      UC Treatments / Results  Labs (all labs ordered are listed, but only abnormal results are displayed) Labs Reviewed - No data to display  EKG   Radiology No results found.  Procedures Procedures (including critical care time)  Medications Ordered in UC Medications - No data to display  Initial Impression / Assessment and Plan / UC Course  I have reviewed the triage vital signs and the nursing notes.  Pertinent labs & imaging results that were available during my care of the patient were reviewed by me and considered in my medical decision making (see chart for details).    Left otitis media, [redacted] weeks pregnant.  Afebrile and vital signs are stable.  No OTC medications taken today.  Treating with amoxicillin.  Tylenol as needed.  Instructed patient to follow-up with her PCP or OB/GYN if she is not improving.  She agrees to plan of care.  Final Clinical Impressions(s) / UC Diagnoses   Final diagnoses:  Left otitis media, unspecified otitis media type  [redacted] weeks gestation of pregnancy     Discharge Instructions      Take the amoxicillin as directed.  Follow-up with  your primary care provider if your symptoms are not  improving.      ED Prescriptions     Medication Sig Dispense Auth. Provider   amoxicillin (AMOXIL) 875 MG tablet Take 1 tablet (875 mg total) by mouth 2 (two) times daily for 10 days. 20 tablet Mickie Bail, NP      PDMP not reviewed this encounter.   Mickie Bail, NP 09/28/23 [phone]

## 2023-10-08 ENCOUNTER — Ambulatory Visit: Payer: Medicaid Other | Admitting: Obstetrics and Gynecology

## 2023-10-08 ENCOUNTER — Other Ambulatory Visit: Payer: Self-pay

## 2023-10-08 ENCOUNTER — Encounter: Payer: Medicaid Other | Admitting: Obstetrics and Gynecology

## 2023-10-08 VITALS — BP 128/67 | HR 80 | Wt 168.0 lb

## 2023-10-08 DIAGNOSIS — Z6791 Unspecified blood type, Rh negative: Secondary | ICD-10-CM

## 2023-10-08 DIAGNOSIS — O0992 Supervision of high risk pregnancy, unspecified, second trimester: Secondary | ICD-10-CM

## 2023-10-08 DIAGNOSIS — Z3402 Encounter for supervision of normal first pregnancy, second trimester: Secondary | ICD-10-CM

## 2023-10-08 DIAGNOSIS — Z3A16 16 weeks gestation of pregnancy: Secondary | ICD-10-CM

## 2023-10-08 DIAGNOSIS — O26892 Other specified pregnancy related conditions, second trimester: Secondary | ICD-10-CM

## 2023-10-08 DIAGNOSIS — O0991 Supervision of high risk pregnancy, unspecified, first trimester: Secondary | ICD-10-CM

## 2023-10-08 NOTE — Progress Notes (Signed)
   Subjective:  Chloe Chen is a 28 y.o. G2P1001 at [redacted]w[redacted]d being seen today for ongoing prenatal care.  She is currently monitored for the following issues for this low-risk pregnancy and has Cystic fibrosis carrier; Anxiety; Hyperemesis gravidarum; Supervision of high risk pregnancy, antepartum, first trimester; and Encounter for supervision of other normal pregnancy, first trimester on their problem list.  Patient reports no complaints.  Contractions: Not present.  .  Movement: Absent. Denies leaking of fluid.   The following portions of the patient's history were reviewed and updated as appropriate: allergies, current medications, past family history, past medical history, past social history, past surgical history and problem list. Problem list updated.  Objective:   Vitals:   10/08/23 1505  BP: 128/67  Pulse: 80  Weight: 168 lb (76.2 kg)    Fetal Status: Fetal Heart Rate (bpm): 145   Movement: Absent     General:  Alert, oriented and cooperative. Patient is in no acute distress.  Skin: Skin is warm and dry. No rash noted.   Cardiovascular: Normal heart rate noted  Respiratory: Normal respiratory effort, no problems with respiration noted  Abdomen: Soft, gravid, appropriate for gestational age. Pain/Pressure: Absent     Pelvic:       Cervical exam deferred        Extremities: Normal range of motion.  Edema: None  Mental Status: Normal mood and affect. Normal behavior. Normal judgment and thought content.   Urinalysis:      Assessment and Plan:  Pregnancy: G2P1001 at [redacted]w[redacted]d 1. Encounter for supervision of low-risk first pregnancy in second trimester (Primary) BP and FHR normal Doing well, feeling regular movement    2. Rh negative state in antepartum period Rhogam at 28 weeks  3. [redacted] weeks gestation of pregnancy Routine care Anatomy scan 2/17 Declined AFP today   There are no diagnoses linked to this encounter. Preterm labor symptoms and general obstetric precautions  including but not limited to vaginal bleeding, contractions, leaking of fluid and fetal movement were reviewed in detail with the patient. Please refer to After Visit Summary for other counseling recommendations.  Future Appointments  Date Time Provider Department Center  10/26/2023  7:15 AM [facility] NURSE [facility]  10/26/2023  7:30 AM [facility] US4 [facility]  11/05/2023  2:55 PM Venora Maples, MD [facility]  12/02/2023  2:55 PM Cresenzo, Cyndi Lennert, MD [facility]    Sue Lush, FNP

## 2023-10-15 ENCOUNTER — Encounter: Payer: Self-pay | Admitting: Family Medicine

## 2023-10-19 DIAGNOSIS — Z8759 Personal history of other complications of pregnancy, childbirth and the puerperium: Secondary | ICD-10-CM | POA: Insufficient documentation

## 2023-10-26 ENCOUNTER — Ambulatory Visit: Payer: Medicaid Other

## 2023-10-26 ENCOUNTER — Ambulatory Visit: Payer: Medicaid Other | Admitting: Obstetrics and Gynecology

## 2023-10-26 ENCOUNTER — Encounter: Payer: Self-pay | Admitting: *Deleted

## 2023-10-26 ENCOUNTER — Ambulatory Visit: Payer: Medicaid Other | Attending: Family Medicine

## 2023-10-26 ENCOUNTER — Ambulatory Visit: Payer: Medicaid Other | Admitting: *Deleted

## 2023-10-26 VITALS — BP 118/63 | HR 69

## 2023-10-26 DIAGNOSIS — F419 Anxiety disorder, unspecified: Secondary | ICD-10-CM | POA: Insufficient documentation

## 2023-10-26 DIAGNOSIS — O360120 Maternal care for anti-D [Rh] antibodies, second trimester, not applicable or unspecified: Secondary | ICD-10-CM | POA: Diagnosis not present

## 2023-10-26 DIAGNOSIS — O09892 Supervision of other high risk pregnancies, second trimester: Secondary | ICD-10-CM | POA: Diagnosis not present

## 2023-10-26 DIAGNOSIS — O09292 Supervision of pregnancy with other poor reproductive or obstetric history, second trimester: Secondary | ICD-10-CM | POA: Insufficient documentation

## 2023-10-26 DIAGNOSIS — Z141 Cystic fibrosis carrier: Secondary | ICD-10-CM

## 2023-10-26 DIAGNOSIS — O99342 Other mental disorders complicating pregnancy, second trimester: Secondary | ICD-10-CM | POA: Insufficient documentation

## 2023-10-26 DIAGNOSIS — O321XX0 Maternal care for breech presentation, not applicable or unspecified: Secondary | ICD-10-CM | POA: Diagnosis not present

## 2023-10-26 DIAGNOSIS — O285 Abnormal chromosomal and genetic finding on antenatal screening of mother: Secondary | ICD-10-CM | POA: Diagnosis not present

## 2023-10-26 DIAGNOSIS — O43192 Other malformation of placenta, second trimester: Secondary | ICD-10-CM | POA: Insufficient documentation

## 2023-10-26 DIAGNOSIS — Z3A19 19 weeks gestation of pregnancy: Secondary | ICD-10-CM

## 2023-10-26 DIAGNOSIS — O0991 Supervision of high risk pregnancy, unspecified, first trimester: Secondary | ICD-10-CM | POA: Insufficient documentation

## 2023-10-26 DIAGNOSIS — Z3A22 22 weeks gestation of pregnancy: Secondary | ICD-10-CM

## 2023-10-26 DIAGNOSIS — Z3481 Encounter for supervision of other normal pregnancy, first trimester: Secondary | ICD-10-CM | POA: Insufficient documentation

## 2023-10-26 DIAGNOSIS — Z363 Encounter for antenatal screening for malformations: Secondary | ICD-10-CM | POA: Diagnosis not present

## 2023-10-26 DIAGNOSIS — Z8759 Personal history of other complications of pregnancy, childbirth and the puerperium: Secondary | ICD-10-CM

## 2023-10-26 NOTE — Progress Notes (Signed)
 Maternal-Fetal Medicine Consultation I had the pleasure of seeing Ms. Chloe Chen [facility]. She is G4 P2012 at 22-weeks' gestation and is here for fetal anatomy scan.  On cell-free fetal DNA screening, the risks of aneuploidies are not increased. MSAFP screening showed low risk for open-neural tube defects.  Obstetric history is significant for a term vaginal delivery in 2023 of a female infant weighing 6 pounds 11 ounces at birth.  Her pregnancy was complicated by fetal growth restriction that resolved before delivery.  Patient had [employer] screening that showed she is a carrier for cystic fibrosis.  According to the patient, her partner is not a carrier.  In this pregnancy, patient had [employer] screening [name]) that show she is not a carrier of cystic fibrosis but possibly it is a variant of uncertain significance.  Ultrasound We performed fetal anatomy scan. No makers of aneuploidies or fetal structural defects are seen. Fetal biometry is consistent with her previously-established dates. Amniotic fluid is normal and good fetal activity is seen. Patient understands the limitations of ultrasound in detecting fetal anomalies.  Marginal cord insertion is seen.  Marginal cord insertion I explained the finding of marginal cord insertion with help of images and diagrams and that it can be associated with fetal growth restriction in some cases.  We will confirm marginal cord insertion and follow-up ultrasound.  Cystic fibrosis carrier Patient had 2 confusing results from 2 different companies.  I recommended discussing with our genetic counselor to clarify the screening results.  History of fetal growth restriction The likelihood of recurrent fetal growth restriction is 20% to 30%.  I recommend fetal growth assessment at in 10 weeks and at [redacted] weeks gestation.  Recommendations -An appointment was made for her to return in 10 weeks for fetal growth  assessment. -Genetic counseling appointment (WebEx) on 10/28/2023  Consultation including face-to-face (more than 50%) counseling 30 minutes.

## 2023-10-28 ENCOUNTER — Ambulatory Visit: Payer: Medicaid Other | Attending: Maternal & Fetal Medicine | Admitting: Obstetrics and Gynecology

## 2023-10-28 DIAGNOSIS — O285 Abnormal chromosomal and genetic finding on antenatal screening of mother: Secondary | ICD-10-CM | POA: Diagnosis not present

## 2023-10-28 DIAGNOSIS — Z3A19 19 weeks gestation of pregnancy: Secondary | ICD-10-CM

## 2023-10-28 DIAGNOSIS — Z141 Cystic fibrosis carrier: Secondary | ICD-10-CM

## 2023-10-28 NOTE — Progress Notes (Signed)
 Virtual Visit via Video Note  I connected with Chloe Chen on 10/28/23 at  3:00 PM EST by a video enabled telemedicine application and verified that I am speaking with the correct person using two identifiers.  Location: Patient: home ([place]) Provider: [facility] [place]   I discussed the limitations of evaluation and management by telemedicine and the availability of in person appointments. The patient expressed understanding and agreed to proceed.  Referring provider: Marcheta Grammes, [facility] Length of consultation: 20 minutes (virtual visit)  [name] was referred to [facility] for genetic counseling to review the results of recent carrier screening for Cystic fibrosis, as she was given conflicting interpretation of results from different labs. She was present on this virtual visit alone and was counseled by [place] Miller, genetic counseling intern, supervised by Katrina Stack, MS, CGC.  [name] underwent carrier screening for cystic fibrosis through two different laboratories, receiving conflicting results due to differences in how each lab classified the same genetic difference (variant). 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 In this pregnancy, [facility] carrier screening through [place] was reported as negative. Upon inquiry, [name] provided a negative result because their data indicates that this genetic change is considered to be a variant of uncertain significance (VUS). A VUS is a genetic difference that has not been definitively classified as either disease-causing or benign. When we review the literature further, there are cases where individuals with this variant and another known CF causing variant have symptoms of CF.  There are also reported persons who have this variant and another CF variant and do not have classic CF.  Some may also be associated with milder CFTR related  symptoms.  To review, CF is a condition characterized by the buildup of thick mucus that can damage the body's organs. Mucus lubricates and protects the linings of the airways, digestive system, reproductive system, and other organs and tissues. Individuals with CF have abnormally sticky mucus that cannot easily be cleared from the airways and digestive system, leading to progressive damage to the respiratory system and chronic digestive system problems. The most common features of CF include respiratory difficulties, bacterial infections in the lungs, the formation of scar tissue (fibrosis) and cysts in the lungs, pancreatic insufficiency, CF-related diabetes mellitus, diarrhea, malnutrition, poor growth, and weight loss. Most men with CF have congenital bilateral absence of the vas deferens (CBAVD) which causes female infertility. With therapies, such as daily respiratory therapies and medications to aid digestion, the median lifespan for people with CF is now in their 40's and beyond. Treatment may involve lung transplantation and CFTR protein modulators in some cases. CF is variably expressed, meaning features of the condition and their severity vary among affected individuals. Expression and severity of CF depends upon the specific mutations present in an affected individual.  The condition is inherited in an autosomal recessive manner, meaning that both parents must be carriers of a change in the gene and pass that change on to a child in order for the child to develop the condition.  Carriers have 1 normally functioning copy of the CFTR gene and 1 copy that contains a variant, whereas affected individuals have inherited two abnormal copies of the gene.  CF is caused by mutations in the CFTR gene. This gene provides instructions for a channel that transports chloride ions into and out of cells. The flow of chloride ions helps control the  movement of water in the body's tissues, which is necessary for the  production of thin, freely flowing mucus. Pathogenic variants in the CFTR gene disrupt the function of the chloride channels, preventing them from regulating the flow of chloride ions and water across cell membranes.   Because the c.794T>G variant may have unclear effects, it is difficult to accurately assess the risks to any pregnancy for [name].  Her husband, [name], had testing previously through [place] and was negative.  This testing greatly reduces, but cannot eliminate, the chance for this pregnancy to be affected with CF.  This result can also not eliminate the chance that [name] is also a carrier of a variant with unclear significance that may not be identified or reported by this laboratory.  Given his results, the residual chance that he is a carrier is estimated to be 1 in 481.  The chance that this pregnancy would inherit the variant from [place] and a non-identified variant from [name] is estimated to be (1X1/481X1/4) 1 in 1,924.  If both members of a couple are known to be carriers, then diagnostic testing is available during pregnancy to determine if the fetus is affected.  This can be performed via [facility] at 10-[redacted] weeks gestation or amniocentesis after [redacted] weeks gestation.  We discussed the risks, benefits and limitations of these testing options. If diagnostic testing during pregnancy is not desired, CF testing can be ordered at the time of delivery and is included in newborn screening in [place].  The newborn screening testing utilizes trypsinogen levels rather than genetic testing to detect affected individuals whereas genetic testing can determine the specific variants, if any, that a child may have inherited. Even if prenatal diagnosis was performed, we cannot predict if a baby would have symptoms of CF or their severity given what is currently understood about this variant.  Routine screening for chromosome conditions was also discussed.  This was previously ordered by her OB and the results  were low risk.  Fetal sex was not included due to patient preference. Additional carrier screening for spinal muscular atrophy and hemoglobinopathies was also negative, which greatly reduces the chance for these conditions.  We also obtained a detailed family history and pregnancy history. Noelia reported a maternal uncle with schizophrenia, which we reviewed is most often thought to be due to complex interaction of genetic and non genetic factors.  The remainder of the family history was unremarkable for birth defects, recurrent pregnancy loss, developmental delays or known genetic conditions. This is the second pregnancy for this couple.  They have a healthy son and reported no complications or exposure to alcohol, tobacco or recreational drugs in this pregnancy.  This information may be important for Ms. Goelz's family members, including her children and siblings, as they consider their own reproductive planning or undergo carrier screening in the future.   Plan of care: Follow up with newborn screening.  In summary, Alexandria carries a genetic change that may contribute to cystic fibrosis if inherited along with another pathogenic (disease-causing) variant. While her partner's screening did not identify him as a carrier, we cannot completely rule out the possibility that he carries a variant that was classified as a VUS. There is an estimated 1 in 1,924 for this pregnancy to inherited two changes in this gene.  We may be reached at [phone] with any questions or concerns.  Cherly Anderson, MS, CGC  In total, 50 minutes were spent on the date of the encounter in service to  the patient including preparation, face-to-face consultation, documentation and care coordination.   Katrina Stack

## 2023-10-29 ENCOUNTER — Ambulatory Visit: Payer: Self-pay

## 2023-11-02 ENCOUNTER — Other Ambulatory Visit: Payer: Self-pay | Admitting: *Deleted

## 2023-11-02 DIAGNOSIS — O43199 Other malformation of placenta, unspecified trimester: Secondary | ICD-10-CM

## 2023-11-02 DIAGNOSIS — Z8759 Personal history of other complications of pregnancy, childbirth and the puerperium: Secondary | ICD-10-CM

## 2023-11-05 ENCOUNTER — Ambulatory Visit: Payer: Medicaid Other | Admitting: Family Medicine

## 2023-11-05 ENCOUNTER — Encounter: Payer: Self-pay | Admitting: Family Medicine

## 2023-11-05 ENCOUNTER — Other Ambulatory Visit: Payer: Self-pay

## 2023-11-05 VITALS — BP 112/73 | HR 67 | Wt 172.1 lb

## 2023-11-05 DIAGNOSIS — Z3A20 20 weeks gestation of pregnancy: Secondary | ICD-10-CM

## 2023-11-05 DIAGNOSIS — O0991 Supervision of high risk pregnancy, unspecified, first trimester: Secondary | ICD-10-CM

## 2023-11-05 DIAGNOSIS — O0992 Supervision of high risk pregnancy, unspecified, second trimester: Secondary | ICD-10-CM

## 2023-11-05 NOTE — Patient Instructions (Signed)

## 2023-11-05 NOTE — Progress Notes (Signed)
   Subjective:  Chloe Chen is a 28 y.o. G2P1001 at [redacted]w[redacted]d being seen today for ongoing prenatal care.  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  Patient reports no complaints.  Contractions: Not present. Vag. Bleeding: None.  Movement: Present. Denies leaking of fluid.   The following portions of the patient's history were reviewed and updated as appropriate: allergies, current medications, past family history, past medical history, past social history, past surgical history and problem list. Problem list updated.  Objective:   Vitals:   11/05/23 1516  BP: 112/73  Pulse: 67  Weight: 172 lb 1.6 oz (78.1 kg)    Fetal Status: Fetal Heart Rate (bpm): 141   Movement: Present     General:  Alert, oriented and cooperative. Patient is in no acute distress.  Skin: Skin is warm and dry. No rash noted.   Cardiovascular: Normal heart rate noted  Respiratory: Normal respiratory effort, no problems with respiration noted  Abdomen: Soft, gravid, appropriate for gestational age. Pain/Pressure: Absent     Pelvic: Vag. Bleeding: None     Cervical exam deferred        Extremities: Normal range of motion.  Edema: None  Mental Status: Normal mood and affect. Normal behavior. Normal judgment and thought content.   Urinalysis:      Assessment and Plan:  Pregnancy: G2P1001 at [redacted]w[redacted]d  1. Supervision of high risk pregnancy, antepartum, first trimester (Primary) BP and FHR normal Anatomy scan normal, f/u scheduled given hx of IUGR in last pregnancy AFP declined today  Preterm labor symptoms and general obstetric precautions including but not limited to vaginal bleeding, contractions, leaking of  fluid and fetal movement were reviewed in detail with the patient. Please refer to After Visit Summary for other counseling recommendations.  Return in 4 weeks (on 12/03/2023) for Dyad patient, ob visit.   Venora Maples, MD

## 2023-11-30 ENCOUNTER — Ambulatory Visit (INDEPENDENT_AMBULATORY_CARE_PROVIDER_SITE_OTHER): Admitting: Family Medicine

## 2023-11-30 ENCOUNTER — Other Ambulatory Visit: Payer: Self-pay

## 2023-11-30 VITALS — BP 109/72 | HR 64 | Wt 181.4 lb

## 2023-11-30 DIAGNOSIS — O0991 Supervision of high risk pregnancy, unspecified, first trimester: Secondary | ICD-10-CM | POA: Diagnosis not present

## 2023-11-30 DIAGNOSIS — Z3A24 24 weeks gestation of pregnancy: Secondary | ICD-10-CM

## 2023-11-30 DIAGNOSIS — O26899 Other specified pregnancy related conditions, unspecified trimester: Secondary | ICD-10-CM

## 2023-11-30 DIAGNOSIS — Z6791 Unspecified blood type, Rh negative: Secondary | ICD-10-CM

## 2023-11-30 NOTE — Progress Notes (Signed)
   PRENATAL VISIT NOTE  Subjective:  Chloe Chen is a 28 y.o. G2P1001 at [redacted]w[redacted]d being seen today for ongoing prenatal care.  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  Patient reports no complaints.  Contractions: Not present. Vag. Bleeding: None.  Movement: Present. Denies leaking of fluid.   The following portions of the patient's history were reviewed and updated as appropriate: allergies, current medications, past family history, past medical history, past social history, past surgical history and problem list.   Objective:   Vitals:   11/30/23 0851  BP: 109/72  Pulse: 64  Weight: 181 lb 6.4 oz (82.3 kg)    Fetal Status: Fetal Heart Rate (bpm): 143   Movement: Present     General:  Alert, oriented and cooperative. Patient is in no acute distress.  Skin: Skin is warm and dry. No rash noted.   Cardiovascular: Normal heart rate noted  Respiratory: Normal respiratory effort, no problems with respiration noted  Abdomen: Soft, gravid, appropriate for gestational age.  Pain/Pressure: Present     Pelvic: Cervical exam deferred        Extremities: Normal range of motion.  Edema: None  Mental Status: Normal mood and affect. Normal behavior. Normal judgment and thought content.   Assessment and Plan:  Pregnancy: G2P1001 at [redacted]w[redacted]d 1. Supervision of high risk pregnancy, antepartum, first trimester (Primary) FHR and BP appropriate today Patient with history of IUGR in previous pregnancy.  Scheduled for follow-up growth ultrasound on 4/24. Continue routine prenatal care  2. Rh negative state in antepartum period Fetus is Rh+ per genetic testing.  3. [redacted] weeks gestation of  pregnancy   Preterm labor symptoms and general obstetric precautions including but not limited to vaginal bleeding, contractions, leaking of fluid and fetal movement were reviewed in detail with the patient. Please refer to After Visit Summary for other counseling recommendations.   No follow-ups on file.  Future Appointments  Date Time Provider Department Center  12/30/2023  1:15 PM Federico Flake, MD [facility]  12/31/2023  7:30 AM [facility] US1 [facility]    Celedonio Savage, MD

## 2023-12-02 ENCOUNTER — Encounter: Payer: Medicaid Other | Admitting: Family Medicine

## 2023-12-30 ENCOUNTER — Encounter: Payer: Medicaid Other | Admitting: Family Medicine

## 2023-12-31 ENCOUNTER — Ambulatory Visit: Admitting: Obstetrics and Gynecology

## 2023-12-31 ENCOUNTER — Encounter: Payer: Self-pay | Admitting: Obstetrics and Gynecology

## 2023-12-31 ENCOUNTER — Ambulatory Visit: Payer: Medicaid Other | Attending: Family Medicine

## 2023-12-31 ENCOUNTER — Other Ambulatory Visit: Payer: Self-pay

## 2023-12-31 VITALS — BP 110/51 | HR 70

## 2023-12-31 DIAGNOSIS — O360130 Maternal care for anti-D [Rh] antibodies, third trimester, not applicable or unspecified: Secondary | ICD-10-CM

## 2023-12-31 DIAGNOSIS — O0991 Supervision of high risk pregnancy, unspecified, first trimester: Secondary | ICD-10-CM | POA: Diagnosis present

## 2023-12-31 DIAGNOSIS — F419 Anxiety disorder, unspecified: Secondary | ICD-10-CM

## 2023-12-31 DIAGNOSIS — Z8759 Personal history of other complications of pregnancy, childbirth and the puerperium: Secondary | ICD-10-CM | POA: Diagnosis present

## 2023-12-31 DIAGNOSIS — O43199 Other malformation of placenta, unspecified trimester: Secondary | ICD-10-CM | POA: Insufficient documentation

## 2023-12-31 DIAGNOSIS — O35EXX0 Maternal care for other (suspected) fetal abnormality and damage, fetal genitourinary anomalies, not applicable or unspecified: Secondary | ICD-10-CM | POA: Diagnosis present

## 2023-12-31 DIAGNOSIS — O99343 Other mental disorders complicating pregnancy, third trimester: Secondary | ICD-10-CM | POA: Diagnosis not present

## 2023-12-31 DIAGNOSIS — O43193 Other malformation of placenta, third trimester: Secondary | ICD-10-CM

## 2023-12-31 DIAGNOSIS — Z3A28 28 weeks gestation of pregnancy: Secondary | ICD-10-CM | POA: Diagnosis present

## 2023-12-31 DIAGNOSIS — O09293 Supervision of pregnancy with other poor reproductive or obstetric history, third trimester: Secondary | ICD-10-CM | POA: Diagnosis not present

## 2023-12-31 NOTE — Progress Notes (Signed)
  Maternal-Fetal Medicine Consultation Name: Chloe Chen MRN: [id number]  [facility] Q6578 at 28w 4d gestation. -Marginal cord insertion seen at previous ultrasound. -Cystic fibrosis carrier and her partner screened negative. - History of fetal growth restriction and previous pregnancy.  Ultrasound Fetal growth is appropriate for gestational age.  Amniotic fluid is normal good fetal activity seen.  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 It measures 4.9 x 2.7 x 2.8 cm. Color-Doppler flow did not show increased vascularity. The reniform shape is lost. -Another smaller cyst is seen in the inferomedial part of the larger cyst. This could represent pelvicalyceal dilation. -Left kidney appears normal. -Bladder appears normal with no evidence of ureterocele.  I counseled the couple on the findings with diagrams and ultrasound images. I gave them the image of normal-appearing kidneys seen at previous ultrasound (this is a new finding).  It is likely to be a simple renal cyst, which has good outcomes. Differential diagnoses include hydronephrosis, duplex collecting system (no ureterocele and no separation of the cortex), multicystic dysplastic kidney (unlikely given that the kidney appeared normal at previous ultrasound). Adrenal cyst (usually hyperechoic cyst with hemorrhage).  I reassured the couple of normal appearing left kidney and fetal survival and outcomes are very good in the presence of normal functioning kidney.  Normal amniotic fluid is indicative of functioning of at least 1 kidney. I counseled the couple that only postnatal examination will reveal the diagnosis.  Manage will will be dependent on the diagnosis.  I recommended serial fetal ultrasound for amniotic fluid assessments.  Recommendations - Limited ultrasound study in 2 weeks to check amniotic fluid and the kidneys. - Fetal growth assessment in 4 weeks.   Consultation  including face-to-face (more than 50%) counseling 30 minutes.

## 2024-01-01 ENCOUNTER — Other Ambulatory Visit: Payer: Self-pay

## 2024-01-01 DIAGNOSIS — O0991 Supervision of high risk pregnancy, unspecified, first trimester: Secondary | ICD-10-CM

## 2024-01-04 ENCOUNTER — Other Ambulatory Visit: Payer: Self-pay

## 2024-01-04 ENCOUNTER — Ambulatory Visit (INDEPENDENT_AMBULATORY_CARE_PROVIDER_SITE_OTHER): Admitting: Family Medicine

## 2024-01-04 ENCOUNTER — Other Ambulatory Visit

## 2024-01-04 VITALS — BP 94/62 | HR 90 | Wt 186.6 lb

## 2024-01-04 DIAGNOSIS — Z141 Cystic fibrosis carrier: Secondary | ICD-10-CM

## 2024-01-04 DIAGNOSIS — Z23 Encounter for immunization: Secondary | ICD-10-CM | POA: Diagnosis not present

## 2024-01-04 DIAGNOSIS — O0990 Supervision of high risk pregnancy, unspecified, unspecified trimester: Secondary | ICD-10-CM | POA: Diagnosis not present

## 2024-01-04 DIAGNOSIS — O210 Mild hyperemesis gravidarum: Secondary | ICD-10-CM

## 2024-01-04 DIAGNOSIS — Z6791 Unspecified blood type, Rh negative: Secondary | ICD-10-CM | POA: Diagnosis not present

## 2024-01-04 DIAGNOSIS — O26899 Other specified pregnancy related conditions, unspecified trimester: Secondary | ICD-10-CM | POA: Diagnosis not present

## 2024-01-04 DIAGNOSIS — O0991 Supervision of high risk pregnancy, unspecified, first trimester: Secondary | ICD-10-CM

## 2024-01-04 DIAGNOSIS — Z8759 Personal history of other complications of pregnancy, childbirth and the puerperium: Secondary | ICD-10-CM | POA: Diagnosis not present

## 2024-01-04 DIAGNOSIS — Z3A29 29 weeks gestation of pregnancy: Secondary | ICD-10-CM

## 2024-01-04 DIAGNOSIS — I863 Vulval varices: Secondary | ICD-10-CM

## 2024-01-04 DIAGNOSIS — F419 Anxiety disorder, unspecified: Secondary | ICD-10-CM

## 2024-01-04 MED ORDER — RHO D IMMUNE GLOBULIN 1500 UNIT/2ML IJ SOSY
300.0000 ug | PREFILLED_SYRINGE | Freq: Once | INTRAMUSCULAR | Status: AC
  Administered 2024-01-04: 300 ug via INTRAMUSCULAR

## 2024-01-04 NOTE — Progress Notes (Unsigned)
   PRENATAL VISIT NOTE  Subjective:  Chloe Chen is a 28 y.o. G2P1001 at [redacted]w[redacted]d being seen today for ongoing prenatal care.  She is currently monitored for the following issues for this high-risk pregnancy and has Rh negative state in antepartum period; Cystic fibrosis carrier; Anxiety; Hyperemesis gravidarum; Supervision of high risk pregnancy, antepartum; and History of prior pregnancy with IUGR newborn on their problem list.  Patient reports  pelvic pressure .  Contractions: Not present. Vag. Bleeding: None.  Movement: Present. Denies leaking of fluid.   The following portions of the patient's history were reviewed and updated as appropriate: allergies, current medications, past family history, past medical history, past social history, past surgical history and problem list.   Objective:   Vitals:   01/04/24 0855  BP: 94/62  Pulse: 90  Weight: 186 lb 9.6 oz (84.6 kg)    Fetal Status: Fetal Heart Rate (bpm): 153   Movement: Present     General:  Alert, oriented and cooperative. Patient is in no acute distress.  Skin: Skin is warm and dry. No rash noted.   Cardiovascular: Normal heart rate noted  Respiratory: Normal respiratory effort, no problems with respiration noted  Abdomen: Soft, gravid, appropriate for gestational age.  Pain/Pressure: Present     Pelvic: Cervical exam performed in the presence of a chaperone        Extremities: Normal range of motion.  Edema: Trace  Mental Status: Normal mood and affect. Normal behavior. Normal judgment and thought content.   Assessment and Plan:  Pregnancy: G2P1001 at [redacted]w[redacted]d 1. [redacted] weeks gestation of pregnancy  2. Supervision of high risk pregnancy, antepartum (Primary) Getting 3rd trimester labs today Tdap and rhogam given Feeling pelvic pressure- desires check today- external 1cm, internal closed. + varicose veins on bilateral labia.  Vigorous movement Vulvar support belt.   3. History of prior pregnancy with IUGR newborn Growth US   scheduled  4. Cystic fibrosis carrier  5. Anxiety  6. Hyperemesis gravidarum improved  Preterm labor symptoms and general obstetric precautions including but not limited to vaginal bleeding, contractions, leaking of fluid and fetal movement were reviewed in detail with the patient. Please refer to After Visit Summary for other counseling recommendations.   Return in about 2 weeks (around 01/18/2024) for Routine prenatal care, [facility].  Future Appointments  Date Time Provider Department Center  01/14/2024  1:15 PM Teena Feast, MD [facility]  01/28/2024  8:00 AM [facility] PROVIDER 1 [facility]  01/28/2024  8:30 AM [facility] US4 [facility]  01/28/2024  1:15 PM Teena Feast, MD [facility]  02/11/2024  8:00 AM [facility] PROVIDER 1 [facility]  02/11/2024  8:30 AM [facility] US2 [facility]  02/25/2024  8:00 AM [facility] PROVIDER 1 [facility]  02/25/2024  8:30 AM [facility] US2 [facility]    Abner Ables, MD

## 2024-01-05 ENCOUNTER — Encounter: Payer: Self-pay | Admitting: Family Medicine

## 2024-01-05 LAB — HIV ANTIBODY (ROUTINE TESTING W REFLEX): HIV Screen 4th Generation wRfx: NONREACTIVE

## 2024-01-05 LAB — CBC
Hematocrit: 35 % (ref 34.0–46.6)
Hemoglobin: 11.7 g/dL (ref 11.1–15.9)
MCH: 29.4 pg (ref 26.6–33.0)
MCHC: 33.4 g/dL (ref 31.5–35.7)
MCV: 88 fL (ref 79–97)
Platelets: 257 10*3/uL (ref 150–450)
RBC: 3.98 x10E6/uL (ref 3.77–5.28)
RDW: 12 % (ref 11.7–15.4)
WBC: 12.9 10*3/uL — ABNORMAL HIGH (ref 3.4–10.8)

## 2024-01-05 LAB — GLUCOSE TOLERANCE, 2 HOURS W/ 1HR
Glucose, 1 hour: 102 mg/dL (ref 70–179)
Glucose, 2 hour: 60 mg/dL — ABNORMAL LOW (ref 70–152)
Glucose, Fasting: 72 mg/dL (ref 70–91)

## 2024-01-05 LAB — ANTIBODY SCREEN: Antibody Screen: NEGATIVE

## 2024-01-05 LAB — RPR: RPR Ser Ql: NONREACTIVE

## 2024-01-14 ENCOUNTER — Ambulatory Visit: Admitting: Family Medicine

## 2024-01-14 ENCOUNTER — Other Ambulatory Visit: Payer: Self-pay

## 2024-01-14 ENCOUNTER — Encounter: Payer: Self-pay | Admitting: Family Medicine

## 2024-01-14 VITALS — BP 103/67 | HR 69 | Wt 186.1 lb

## 2024-01-14 DIAGNOSIS — Z3A30 30 weeks gestation of pregnancy: Secondary | ICD-10-CM

## 2024-01-14 DIAGNOSIS — O283 Abnormal ultrasonic finding on antenatal screening of mother: Secondary | ICD-10-CM | POA: Insufficient documentation

## 2024-01-14 DIAGNOSIS — O0990 Supervision of high risk pregnancy, unspecified, unspecified trimester: Secondary | ICD-10-CM

## 2024-01-14 DIAGNOSIS — Z6791 Unspecified blood type, Rh negative: Secondary | ICD-10-CM

## 2024-01-14 DIAGNOSIS — O26893 Other specified pregnancy related conditions, third trimester: Secondary | ICD-10-CM

## 2024-01-14 DIAGNOSIS — O26899 Other specified pregnancy related conditions, unspecified trimester: Secondary | ICD-10-CM

## 2024-01-14 NOTE — Patient Instructions (Signed)

## 2024-01-14 NOTE — Progress Notes (Signed)
   Subjective:  Chloe Chen is a 28 y.o. G2P1001 at [redacted]w[redacted]d being seen today for ongoing prenatal care.  She is currently monitored for the following issues for this low-risk pregnancy and has Rh negative state in antepartum period; Cystic fibrosis carrier; Anxiety; Hyperemesis gravidarum; Supervision of high risk pregnancy, antepartum; History of prior pregnancy with IUGR newborn; and Abnormal fetal ultrasound on their problem list.  Patient reports no complaints.  Contractions: Irritability. Vag. Bleeding: None.  Movement: Present. Denies leaking of fluid.   The following portions of the patient's history were reviewed and updated as appropriate: allergies, current medications, past family history, past medical history, past social history, past surgical history and problem list. Problem list updated.  Objective:   Vitals:   01/14/24 1333  BP: 103/67  Pulse: 69  Weight: 186 lb 1.6 oz (84.4 kg)    Fetal Status: Fetal Heart Rate (bpm): 140   Movement: Present     General:  Alert, oriented and cooperative. Patient is in no acute distress.  Skin: Skin is warm and dry. No rash noted.   Cardiovascular: Normal heart rate noted  Respiratory: Normal respiratory effort, no problems with respiration noted  Abdomen: Soft, gravid, appropriate for gestational age. Pain/Pressure: Absent     Pelvic: Vag. Bleeding: None     Cervical exam deferred        Extremities: Normal range of motion.  Edema: None  Mental Status: Normal mood and affect. Normal behavior. Normal judgment and thought content.   Urinalysis:      Assessment and Plan:  Pregnancy: G2P1001 at [redacted]w[redacted]d  1. Supervision of high risk pregnancy, antepartum (Primary) BP and FHR normal  2. Rh negative state in antepartum period S/p rhogam on 01/04/2024 Fetus is Rh positive  3. Abnormal fetal ultrasound Simple renal cyst on last growth scan Normal AFI, likely good prognosis per MFM Serial growth scans and AFI checks Discussed with  patient  Preterm labor symptoms and general obstetric precautions including but not limited to vaginal bleeding, contractions, leaking of fluid and fetal movement were reviewed in detail with the patient. Please refer to After Visit Summary for other counseling recommendations.  Return in about 2 weeks (around 01/28/2024) for Dyad patient, ob visit.   Teena Feast, MD

## 2024-01-28 ENCOUNTER — Ambulatory Visit: Attending: Obstetrics and Gynecology | Admitting: Obstetrics and Gynecology

## 2024-01-28 ENCOUNTER — Encounter: Payer: Self-pay | Admitting: Family Medicine

## 2024-01-28 ENCOUNTER — Other Ambulatory Visit: Payer: Self-pay

## 2024-01-28 ENCOUNTER — Ambulatory Visit: Admitting: Family Medicine

## 2024-01-28 ENCOUNTER — Ambulatory Visit (HOSPITAL_BASED_OUTPATIENT_CLINIC_OR_DEPARTMENT_OTHER)

## 2024-01-28 VITALS — BP 105/68 | HR 79 | Wt 190.1 lb

## 2024-01-28 DIAGNOSIS — O99340 Other mental disorders complicating pregnancy, unspecified trimester: Secondary | ICD-10-CM | POA: Diagnosis not present

## 2024-01-28 DIAGNOSIS — O0993 Supervision of high risk pregnancy, unspecified, third trimester: Secondary | ICD-10-CM

## 2024-01-28 DIAGNOSIS — Z6791 Unspecified blood type, Rh negative: Secondary | ICD-10-CM

## 2024-01-28 DIAGNOSIS — O09293 Supervision of pregnancy with other poor reproductive or obstetric history, third trimester: Secondary | ICD-10-CM | POA: Insufficient documentation

## 2024-01-28 DIAGNOSIS — O0990 Supervision of high risk pregnancy, unspecified, unspecified trimester: Secondary | ICD-10-CM

## 2024-01-28 DIAGNOSIS — Z3A32 32 weeks gestation of pregnancy: Secondary | ICD-10-CM

## 2024-01-28 DIAGNOSIS — O09893 Supervision of other high risk pregnancies, third trimester: Secondary | ICD-10-CM | POA: Diagnosis not present

## 2024-01-28 DIAGNOSIS — O99343 Other mental disorders complicating pregnancy, third trimester: Secondary | ICD-10-CM

## 2024-01-28 DIAGNOSIS — Z3A30 30 weeks gestation of pregnancy: Secondary | ICD-10-CM

## 2024-01-28 DIAGNOSIS — O360130 Maternal care for anti-D [Rh] antibodies, third trimester, not applicable or unspecified: Secondary | ICD-10-CM

## 2024-01-28 DIAGNOSIS — O283 Abnormal ultrasonic finding on antenatal screening of mother: Secondary | ICD-10-CM

## 2024-01-28 DIAGNOSIS — O26893 Other specified pregnancy related conditions, third trimester: Secondary | ICD-10-CM

## 2024-01-28 DIAGNOSIS — O43193 Other malformation of placenta, third trimester: Secondary | ICD-10-CM | POA: Insufficient documentation

## 2024-01-28 DIAGNOSIS — Z141 Cystic fibrosis carrier: Secondary | ICD-10-CM

## 2024-01-28 DIAGNOSIS — F419 Anxiety disorder, unspecified: Secondary | ICD-10-CM | POA: Diagnosis not present

## 2024-01-28 DIAGNOSIS — Z362 Encounter for other antenatal screening follow-up: Secondary | ICD-10-CM | POA: Insufficient documentation

## 2024-01-28 DIAGNOSIS — Z8759 Personal history of other complications of pregnancy, childbirth and the puerperium: Secondary | ICD-10-CM

## 2024-01-28 NOTE — Patient Instructions (Signed)

## 2024-01-28 NOTE — Progress Notes (Signed)
 Maternal-Fetal Medicine Consultation Name: Chloe Chen MRN: [id number]  G4 I6962 at 32w 4d gestation. Right renal cyst was seen at previous ultrasound. Patient does not have gestational diabetes.  Ultrasound Fetal growth is appropriate for gestational age.  Abdominal circumference measurement is at the 98th  percentile.  Amniotic fluid is normal and good fetal activity seen.  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  The cyst is unilocular and clear with no evidence of hemorrhage.  Right kidney seems compressed and has mild urinary tract dilation.  The left kidney appears normal.  Bladder appears normal with no ureterocele.  I explained the findings.  It appears to be a solitary unilocular cyst.  Will measure the abdominal circumference at the level of the cyst at next visit (36 weeks) that will help deciding mode of delivery. I offered to set up a consultation with pediatric urologist [facility]).  The couple will discuss and decide.  Recommendations -Limited ultrasound in 2 weeks to check amniotic fluid. - An appointment was made for her to return in 4 weeks for fetal growth assessment.  Consultation including face-to-face (more than 50%) counseling 10 minutes.

## 2024-01-28 NOTE — Progress Notes (Signed)
   Subjective:  Chloe Chen is a 28 y.o. G2P1001 at [redacted]w[redacted]d being seen today for ongoing prenatal care.  She is currently monitored for the following issues for this low-risk pregnancy and has Rh negative state in antepartum period; Cystic fibrosis carrier; Anxiety; Hyperemesis gravidarum; Supervision of high risk pregnancy, antepartum; History of prior pregnancy with IUGR newborn; and Abnormal fetal ultrasound on their problem list.  Patient reports no complaints.  Contractions: Irritability. Vag. Bleeding: None.  Movement: Present. Denies leaking of fluid.   The following portions of the patient's history were reviewed and updated as appropriate: allergies, current medications, past family history, past medical history, past social history, past surgical history and problem list. Problem list updated.  Objective:   Vitals:   01/28/24 1328  BP: 105/68  Pulse: 79  Weight: 190 lb 1.6 oz (86.2 kg)    Fetal Status: Fetal Heart Rate (bpm): 128   Movement: Present     General:  Alert, oriented and cooperative. Patient is in no acute distress.  Skin: Skin is warm and dry. No rash noted.   Cardiovascular: Normal heart rate noted  Respiratory: Normal respiratory effort, no problems with respiration noted  Abdomen: Soft, gravid, appropriate for gestational age. Pain/Pressure: Present     Pelvic: Vag. Bleeding: None     Cervical exam deferred        Extremities: Normal range of motion.  Edema: None  Mental Status: Normal mood and affect. Normal behavior. Normal judgment and thought content.   Urinalysis:      Assessment and Plan:  Pregnancy: G2P1001 at [redacted]w[redacted]d  1. Supervision of high risk pregnancy, antepartum (Primary) BP and FHR normal  2. Rh negative state in antepartum period S/p rhogam Fetus rh positive  3. Abnormal fetal ultrasound Slightly larger on follow up US  today Pediatric urology consultation discussed with patient during MFM visit, after thinking about it she would like to  proceed Order placed for referral to [facility]  Preterm labor symptoms and general obstetric precautions including but not limited to vaginal bleeding, contractions, leaking of fluid and fetal movement were reviewed in detail with the patient. Please refer to After Visit Summary for other counseling recommendations.  Return in about 2 weeks (around 02/11/2024) for Dyad patient, ob visit.   Teena Feast, MD

## 2024-02-11 ENCOUNTER — Other Ambulatory Visit: Payer: Self-pay | Admitting: Obstetrics and Gynecology

## 2024-02-11 ENCOUNTER — Ambulatory Visit: Attending: Obstetrics and Gynecology

## 2024-02-11 ENCOUNTER — Other Ambulatory Visit: Payer: Self-pay

## 2024-02-11 ENCOUNTER — Other Ambulatory Visit: Payer: Self-pay | Admitting: *Deleted

## 2024-02-11 ENCOUNTER — Ambulatory Visit (INDEPENDENT_AMBULATORY_CARE_PROVIDER_SITE_OTHER): Admitting: Obstetrics and Gynecology

## 2024-02-11 ENCOUNTER — Ambulatory Visit: Admitting: Maternal & Fetal Medicine

## 2024-02-11 VITALS — BP 117/72 | HR 93 | Wt 192.7 lb

## 2024-02-11 VITALS — BP 116/58

## 2024-02-11 DIAGNOSIS — Z141 Cystic fibrosis carrier: Secondary | ICD-10-CM

## 2024-02-11 DIAGNOSIS — O09293 Supervision of pregnancy with other poor reproductive or obstetric history, third trimester: Secondary | ICD-10-CM

## 2024-02-11 DIAGNOSIS — O35EXX0 Maternal care for other (suspected) fetal abnormality and damage, fetal genitourinary anomalies, not applicable or unspecified: Secondary | ICD-10-CM

## 2024-02-11 DIAGNOSIS — O99343 Other mental disorders complicating pregnancy, third trimester: Secondary | ICD-10-CM | POA: Diagnosis not present

## 2024-02-11 DIAGNOSIS — Z3A34 34 weeks gestation of pregnancy: Secondary | ICD-10-CM

## 2024-02-11 DIAGNOSIS — O43193 Other malformation of placenta, third trimester: Secondary | ICD-10-CM

## 2024-02-11 DIAGNOSIS — F419 Anxiety disorder, unspecified: Secondary | ICD-10-CM

## 2024-02-11 DIAGNOSIS — O0990 Supervision of high risk pregnancy, unspecified, unspecified trimester: Secondary | ICD-10-CM

## 2024-02-11 DIAGNOSIS — O283 Abnormal ultrasonic finding on antenatal screening of mother: Secondary | ICD-10-CM

## 2024-02-11 DIAGNOSIS — Z8759 Personal history of other complications of pregnancy, childbirth and the puerperium: Secondary | ICD-10-CM | POA: Insufficient documentation

## 2024-02-11 DIAGNOSIS — Z6791 Unspecified blood type, Rh negative: Secondary | ICD-10-CM

## 2024-02-11 DIAGNOSIS — N281 Cyst of kidney, acquired: Secondary | ICD-10-CM

## 2024-02-11 DIAGNOSIS — O26899 Other specified pregnancy related conditions, unspecified trimester: Secondary | ICD-10-CM

## 2024-02-11 NOTE — Progress Notes (Signed)
 Patient information  Patient Name: Chloe Chen  Patient MRN:   [id number]  Referring practice: MFM Referring Provider: [facility])  MFM CONSULT  Chloe Chen is a 28 y.o. G2P1001 at [redacted]w[redacted]d here for ultrasound and consultation. 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  I discussed the ultrasound findings again with the patient and her husband.  There continues to be a very large suspected renal cyst at the upper pole of the right kidney.  This is artificially increased the abdominal circumference raising the estimated fetal weight over the 99th percentile.  The abdomen is already measuring at the 41st week.  We discussed that a cesarean delivery may be likely if the abdominal circumference continues to increase.  Since the cyst is large enough to potentially compromise the fetal cardiovascular system weekly antenatal testing is recommended. Postnatal delivery treatment will depend on confirming the diagnosis with imaging and treating the underlying cause. In case of singular giant renal cyst in a newborn, sonographically-guided drainage and sclerotherapy with doxycycline are treatment options that might be successful.  Sonographic findings Single intrauterine pregnancy at 34w 4d.  Fetal cardiac activity:  Observed and appears normal. Presentation: Cephalic. Interval fetal anatomy appears normal except for the known large suspected renal cyst measuring 8.7 x 5.9 x 6.4 cm. Fetal biometry shows the estimated fetal weight at the > 99 percentile (~41w)  due to the artificially inflated AC from the renal cyst. Amniotic fluid volume: Within normal limits. MVP: 5.35 cm. Placenta: Anterior. BPP 8/8.   There are limitations of prenatal ultrasound such as the inability to detect certain abnormalities due to poor visualization. Various factors such as fetal position, gestational age and maternal body habitus may increase the difficulty in visualizing the fetal anatomy.    Recommendations - Follow-up growth ultrasound in 4 weeks - Continue weekly antenatal testing - Delivery likely around 39 weeks - Route of delivery pending the clinical course but likely a cesarean delivery due to the very large abdominal circumference - The patient reports her OB provider has arranged consultation with pediatric urology to discuss postnatal management  Review of Systems: A review of systems was performed and was negative except per HPI   Vitals and Physical Exam    02/11/2024    1:34 PM 02/11/2024    7:50 AM 01/28/2024    1:28 PM  Vitals with BMI  Weight 192 lbs 11 oz  190 lbs 2 oz  Systolic 117 116 951  Diastolic 72 58 68  Pulse 93  79    Sitting comfortably on the sonogram table Nonlabored breathing Normal rate and rhythm Abdomen is nontender  Past pregnancies OB History  Gravida Para Term Preterm AB Living  2 1 1  0 0 1  SAB IAB Ectopic Multiple Live Births  0 0 0 0 1    # Outcome Date GA Lbr Len/2nd Weight Sex Type Anes PTL Lv  2 Current           1 Term 01/28/22 [redacted]w[redacted]d  6 lb 11.6 oz (3.05 kg) M Vag-Spont  LIV    I spent 30 minutes reviewing the patients chart, including labs and images as well as counseling the patient about her medical conditions. Greater than 50% of the time was spent in direct face-to-face patient counseling.  Chloe Chen  MFM, [facility]   02/11/2024  2:38 PM

## 2024-02-11 NOTE — Progress Notes (Signed)
 PRENATAL VISIT NOTE  Subjective:  Chloe Chen is a 28 y.o. G2P1001 at [redacted]w[redacted]d being seen today for ongoing prenatal care.  She is currently monitored for the following issues for this high-risk pregnancy and has Rh negative state in antepartum period; Cystic fibrosis carrier; Anxiety; Hyperemesis gravidarum; Supervision of high risk pregnancy, antepartum; History of prior pregnancy with IUGR newborn; Abnormal fetal ultrasound; and [redacted] weeks gestation of pregnancy on their problem list.  Patient reports no complaints.  Contractions: Irritability. Vag. Bleeding: None.  Movement: Present. Denies leaking of fluid.   The following portions of the patient's history were reviewed and updated as appropriate: allergies, current medications, past family history, past medical history, past social history, past surgical history and problem list.   Objective:    Vitals:   02/11/24 1334  BP: 117/72  Pulse: 93  Weight: 192 lb 11.2 oz (87.4 kg)    Fetal Status:  Fetal Heart Rate (bpm): 144   Movement: Present    General: Alert, oriented and cooperative. Patient is in no acute distress.  Skin: Skin is warm and dry. No rash noted.   Cardiovascular: Normal heart rate noted  Respiratory: Normal respiratory effort, no problems with respiration noted  Abdomen: Soft, gravid, appropriate for gestational age.  Pain/Pressure: Present     Pelvic: Cervical exam deferred        Extremities: Normal range of motion.  Edema: None  Mental Status: Normal mood and affect. Normal behavior. Normal judgment and thought content.   Assessment and Plan:  Pregnancy: G2P1001 at [redacted]w[redacted]d 1. [redacted] weeks gestation of pregnancy (Primary) 2. Supervision of high risk pregnancy, antepartum BP and FHR normal  Swabs at next visit  3. Abnormal fetal ultrasound Solitary simple right renal cyst 6.38 x 8.72 x 5.86 cm today, slightly larger than last check 6.8 x 4.7 x 4.7 cm on 5/22.  MFM note from today pending, will follow up  recommendations  Ped Urology referral placed last visit   4. 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 Chloe Chen classifies variant as VUS.   [name] Neg  5. History of prior pregnancy with IUGR newborn Serial growth ultrasounds every 4-6 weeks until delivery Antenatal testing to start around 32 weeks  Delivery around 38-[redacted] weeks gestation (per patients understanding of MFM after appointment today)  6. Rh negative state in antepartum period S/p Rhogam. Babe Rh positive on Panorama Post partum Rhogam indicated  7. Anxiety Coping well  Preterm labor symptoms and general obstetric precautions including but not limited to vaginal bleeding, contractions, leaking of fluid and fetal movement were reviewed in detail with the patient. Please refer to After Visit Summary for other counseling recommendations.   Return in 2 weeks (on 02/25/2024) for MB ROB.  Future Appointments  Date Time Provider Department Center  02/18/2024  8:30 AM [facility] NURSE [facility]  02/18/2024  8:45 AM [facility] NST [facility]  02/25/2024  9:35 AM Teena Feast, MD [facility]  02/25/2024 10:30 AM [facility] NURSE [facility]  02/25/2024 10:45 AM [facility] NST [facility]  03/02/2024  8:55 AM Abner Ables, MD [facility]  03/03/2024  8:30 AM [facility] NURSE [facility]  03/03/2024  8:45 AM [facility] NST [facility]  03/08/2024  8:55 AM Ebony Goldstein, MD [facility]  03/14/2024 11:00 AM [facility] PROVIDER 1 [facility]  03/14/2024 11:30 AM [facility] US1 [facility]  03/16/2024  8:55 AM Abner Ables, MD [facility]  03/23/2024  1:35 PM [facility] US2 [facility]  03/23/2024  2:35 PM Abner Ables, MD [facility]    [place], MD

## 2024-02-11 NOTE — Patient Instructions (Addendum)
 Third Trimester of Pregnancy  The third trimester of pregnancy is from week 28 through week 40. This is months 7 through 9. The third trimester is a time when your baby is growing fast. Body changes during your third trimester Your body continues to change during this time. The changes usually go away after your baby is born. Physical changes You will continue to gain weight. You may get stretch marks on your hips, belly, and breasts. Your breasts will keep growing and may hurt. A yellow fluid (colostrum) may leak from your breasts. This is the first milk you're making for your baby. Your hair may grow faster and get thicker. In some cases, you may get hair loss. Your belly button may stick out. You may have more swelling in your hands, face, or ankles. Health changes You may have heartburn. You may feel short of breath. This is caused by the uterus that is now bigger. You may have more aches in the pelvis, back, or thighs. You may have more tingling or numbness in your hands, arms, and legs. You may pee more often. You may have trouble pooping (constipation) or swollen veins in the butt that can itch or get painful (hemorrhoids). Other changes You may have more problems sleeping. You may notice the baby moving lower in your belly (dropping). You may have more fluid coming from your vagina. Your joints may feel loose, and you may have pain around your pelvic bone. Follow these instructions at home: Medicines Take medicines only as told by your health care provider. Some medicines are not safe during pregnancy. Your provider may change the medicines that you take. Do not take any medicines unless told to by your provider. Take a prenatal vitamin that has at least 600 micrograms (mcg) of folic acid. Do not use herbal medicines, illegal drugs, or medicines that are not approved by your provider. Eating and drinking While you're pregnant your body needs additional nutrition to help  support your growing baby. Talk with your provider about your nutritional needs. Activity Most women are able to exercise regularly during pregnancy. Exercise routines may need to change at the end of your pregnancy. Talk to your provider about your activities and exercise routine. Relieving pain and discomfort Rest often with your legs raised if you have leg cramps or low back pain. Take warm sitz baths to soothe pain from hemorrhoids. Use hemorrhoid cream if your provider says it's okay. Wear a good, supportive bra if your breasts hurt. Do not use hot tubs, steam rooms, or saunas. Do not douche. Do not use tampons or scented pads. Safety Talk to your provider before traveling far distances. Wear your seatbelt at all times when you're in a car. Talk to your provider if someone hits you, hurts you, or yells at you. Preparing for birth To prepare for your baby: Take childbirth and breastfeeding classes. Visit the hospital and tour the maternity area. Buy a rear-facing car seat. Learn how to install it in your car. General instructions Avoid cat litter boxes and soil used by cats. These things carry germs that can cause harm to your pregnancy and your baby. Do not drink alcohol, smoke, vape, or use products with nicotine or tobacco in them. If you need help quitting, talk with your provider. Keep all follow-up visits for your third trimester. Your provider will do more exams and tests during this trimester. Write down your questions. Take them to your prenatal visits. Your provider also will: Talk with you about  your overall health. Give you advice or refer you to specialists who can help with different needs, including: Mental health and counseling. Foods and healthy eating. Ask for help if you need help with food. Where to find more information American Pregnancy Association: americanpregnancy.org [employer] of [employer]etricians and Gynecologists: acog.org Office on [employer] Health:  TravelLesson.ca Contact a health care provider if: You have a headache that does not go away when you take medicine. You have any of these problems: You can't eat or drink. You have nausea and vomiting. You have watery poop (diarrhea) for 2 days or more. You have pain when you pee, or your pee smells bad. You have been sick for 2 days or more and aren't getting better. Contact your provider right away if: You have any of these coming from your vagina: Abnormal discharge. Bad-smelling fluid. Bleeding. Your baby is moving less than usual. You have signs of labor: You have any contractions, belly cramping, or have pain in your pelvis or lower back before 37 weeks of pregnancy (preterm labor). You have regular contractions that are less than 5 minutes apart. Your water breaks. You have symptoms of high blood pressure or preeclampsia. These include: A severe, throbbing headache that does not go away. Sudden or extreme swelling of your face, hands, legs, or feet. Vision problems: You see spots. You have blurry vision. Your eyes are sensitive to light. If you can't reach your provider, go to an urgent care or emergency room. Get help right away if: You faint, become confused, or can't think clearly. You have chest pain or trouble breathing. You have any kind of injury, such as from a fall or a car crash. These symptoms may be an emergency. Call 911 right away. Do not wait to see if the symptoms will go away. Do not drive yourself to the hospital. This information is not intended to replace advice given to you by your health care provider. Make sure you discuss any questions you have with your health care provider. Document Revised: 05/28/2023 Document Reviewed: 12/26/2022 Elsevier Patient Education  2024 Elsevier Inc. Early Labor (Pre-Term Labor): What to Know Pregnancy normally lasts 39-41 weeks. Pre-term labor is when labor starts before you have been pregnant for 37 weeks. Babies  who are born too early may be at an increased risk for long-term problems like cerebral palsy, developmental delays, and vision and hearing problems. Premature babies may also have problems soon after birth, such as problems with their blood sugar, body temperature, heart, and breathing. These babies often have trouble with feeding. These problems may be very serious in babies who are born before 34 weeks of pregnancy. What are the causes? The cause of pre-term labor is not known. What increases the risk? You're more likely to have pre-term labor if: You have medical problems, now or in the past. You have problems now or in your past pregnancies. You have risk factors related to lifestyle, environment, and age. Medical history You have problems affecting your uterus, including a short cervix. You have a sexually transmitted infection (STI) or other infections of the urinary tract and the vagina. You have: Blood clotting problems. High blood pressure. High blood sugar. You have a low body weight or too much body weight. Present and past pregnancies You have had pre-term labor before. You're pregnant with more than one baby. The placenta covers your cervix,. This is called placenta previa. Your unborn baby has a congenital condition. This means your baby will be born with  the condition. You have bleeding from your vagina while you're pregnant. You became pregnant through in vitro fertilization (IVF). Lifestyle and environmental factors You use drugs, tobacco products or drink alcohol. You have stress and no social support. You experience domestic violence. You're exposed to certain chemicals at home or work. Other factors You're younger than age 40 or older than age 66. What are the signs or symptoms?  Cramps like those that can happen during a menstrual period. The cramps may happen with diarrhea, which is watery poop. Pain your belly or lower back. Regular contractions. It may feel  like your belly is getting tighter. Pressure in your pelvis. Increased watery or bloody discharge from your vagina. Your water breaking. How is this diagnosed? This condition is diagnosed based on: Your medical history and a physical exam. A pelvic exam. An ultrasound. Monitoring for contractions. Other tests, including: A swab of the cervix to check for a protein substance called fetal fibronectin. This protein is usually present in the area between the uterus and the amniotic sac early in pregnancy and then again towards the end. If it's found in the middle of pregnancy, it can sometimes be a sign of pre-term labor. Urine tests. How is this treated? Treatment depends on how far along your pregnancy is, the health of your baby, and your health. Treatment may include: Taking medicines to: Stop contractions. Help the baby's lungs mature if the risk of early delivery is high. Medicines to help prevent your baby from having cerebral palsy or other problems. Bed rest. If the labor happens before 34 weeks of pregnancy, you may need to stay in the hospital. Delivery of the baby. Follow these instructions at home: Do not smoke, vape, or use nicotine or tobacco. Do not drink alcohol. Take your medicines only as told. Rest as told by your provider. Ask what things are safe for you to do at home. Ask when you can go back to work or school. Keep all follow-up visits. Your provider will need to closely follow your health and the health of your baby. How is this prevented? To increase your chance of having a full-term pregnancy: Do not use drugs or take medicines that have not been prescribed to you during your pregnancy. Talk with your provider before taking any herbal supplements, even if you have been taking them regularly. Gain a healthy amount of weight during your pregnancy. Watch for infection. If you think that you might have an infection, get it checked right away. Symptoms of infection  may include: Fever. Discharge from your vagina that smells bad or is not normal. Pain or burning when you pee. Having to pee small amounts or very often. Blood in your pee. Where to find more information To learn more, go to these websites: U.S. Department of Health and Cytogeneticist on Women's Health: http://hoffman.com/ [employer] of [employer]stetricians and Gynecologists: www.acog.org Centers for Disease Control and Prevention at DiningCalendar.de. Then: Click "Search" and type "preterm labor." Find the link you need. Contact a health care provider if: You think you're going into pre-term labor. You have signs or symptoms of pre-term labor. You have symptoms of infection. Get help right away if: You're having regular, painful contractions every 5 minutes or less. Your water breaks. This information is not intended to replace advice given to you by your health care provider. Make sure you discuss any questions you have with your health care provider. Document Revised: 03/05/2023 Document Reviewed: 03/05/2023 Elsevier Patient Education  2024 Elsevier Inc.

## 2024-02-12 ENCOUNTER — Encounter: Payer: Self-pay | Admitting: Family Medicine

## 2024-02-18 ENCOUNTER — Encounter: Payer: Self-pay | Admitting: Family Medicine

## 2024-02-18 ENCOUNTER — Ambulatory Visit: Attending: Obstetrics and Gynecology

## 2024-02-18 ENCOUNTER — Other Ambulatory Visit: Payer: Self-pay | Admitting: *Deleted

## 2024-02-18 ENCOUNTER — Telehealth: Payer: Self-pay

## 2024-02-18 ENCOUNTER — Ambulatory Visit: Admitting: *Deleted

## 2024-02-18 VITALS — BP 127/66 | HR 77

## 2024-02-18 DIAGNOSIS — O283 Abnormal ultrasonic finding on antenatal screening of mother: Secondary | ICD-10-CM

## 2024-02-18 DIAGNOSIS — Z3A35 35 weeks gestation of pregnancy: Secondary | ICD-10-CM

## 2024-02-18 DIAGNOSIS — O35EXX0 Maternal care for other (suspected) fetal abnormality and damage, fetal genitourinary anomalies, not applicable or unspecified: Secondary | ICD-10-CM | POA: Diagnosis not present

## 2024-02-18 DIAGNOSIS — Z8759 Personal history of other complications of pregnancy, childbirth and the puerperium: Secondary | ICD-10-CM

## 2024-02-18 DIAGNOSIS — O0990 Supervision of high risk pregnancy, unspecified, unspecified trimester: Secondary | ICD-10-CM

## 2024-02-18 NOTE — Procedures (Signed)
 Chloe Chen 1995/09/29 [redacted]w[redacted]d  Fetus A Non-Stress Test Interpretation for 02/18/24  Indication: Fetal renal cyst - NST only  Fetal Heart Rate A Mode: External Baseline Rate (A): 115 bpm Variability: Moderate Accelerations: 15 x 15 Decelerations: None Multiple birth?: No  Uterine Activity Mode: Toco, Palpation Contraction Frequency (min): none noted Resting Tone Palpated: Relaxed  Interpretation (Fetal Testing) Nonstress Test Interpretation: Reactive Comments: Reviewed with Dr. Nolan Battle

## 2024-02-22 ENCOUNTER — Telehealth: Payer: Self-pay | Admitting: Family Medicine

## 2024-02-22 ENCOUNTER — Encounter: Payer: Self-pay | Admitting: Maternal & Fetal Medicine

## 2024-02-22 NOTE — Telephone Encounter (Signed)
 Attempted to call patient to discuss coordinating tomorrow's visit, no answer. Left voicemail, will also follow up by MyChart message.

## 2024-02-23 ENCOUNTER — Observation Stay (HOSPITAL_BASED_OUTPATIENT_CLINIC_OR_DEPARTMENT_OTHER)

## 2024-02-23 ENCOUNTER — Encounter (HOSPITAL_COMMUNITY): Payer: Self-pay | Admitting: Maternal & Fetal Medicine

## 2024-02-23 ENCOUNTER — Observation Stay (HOSPITAL_COMMUNITY): Admission: RE | Admit: 2024-02-23 | Source: Ambulatory Visit

## 2024-02-23 ENCOUNTER — Other Ambulatory Visit: Payer: Self-pay

## 2024-02-23 ENCOUNTER — Inpatient Hospital Stay (HOSPITAL_COMMUNITY)
Admission: RE | Admit: 2024-02-23 | Discharge: 2024-02-26 | DRG: 807 | Disposition: A | Discharge status: Home / Self Care | Attending: Obstetrics and Gynecology | Admitting: Obstetrics and Gynecology

## 2024-02-23 ENCOUNTER — Encounter (HOSPITAL_COMMUNITY): Payer: Self-pay

## 2024-02-23 DIAGNOSIS — O283 Abnormal ultrasonic finding on antenatal screening of mother: Principal | ICD-10-CM | POA: Diagnosis present

## 2024-02-23 DIAGNOSIS — Z3A36 36 weeks gestation of pregnancy: Secondary | ICD-10-CM | POA: Diagnosis not present

## 2024-02-23 DIAGNOSIS — Z30017 Encounter for initial prescription of implantable subdermal contraceptive: Secondary | ICD-10-CM

## 2024-02-23 DIAGNOSIS — O35EXX0 Maternal care for other (suspected) fetal abnormality and damage, fetal genitourinary anomalies, not applicable or unspecified: Secondary | ICD-10-CM | POA: Diagnosis present

## 2024-02-23 DIAGNOSIS — Z6791 Unspecified blood type, Rh negative: Secondary | ICD-10-CM | POA: Diagnosis not present

## 2024-02-23 DIAGNOSIS — O26899 Other specified pregnancy related conditions, unspecified trimester: Secondary | ICD-10-CM

## 2024-02-23 DIAGNOSIS — O0990 Supervision of high risk pregnancy, unspecified, unspecified trimester: Secondary | ICD-10-CM

## 2024-02-23 DIAGNOSIS — O26893 Other specified pregnancy related conditions, third trimester: Secondary | ICD-10-CM | POA: Diagnosis present

## 2024-02-23 DIAGNOSIS — Z141 Cystic fibrosis carrier: Secondary | ICD-10-CM | POA: Diagnosis not present

## 2024-02-23 LAB — TYPE AND SCREEN
ABO/RH(D): O NEG
Antibody Screen: POSITIVE

## 2024-02-23 LAB — CBC
HCT: 33 % — ABNORMAL LOW (ref 36.0–46.0)
Hemoglobin: 10.1 g/dL — ABNORMAL LOW (ref 12.0–15.0)
MCH: 25.5 pg — ABNORMAL LOW (ref 26.0–34.0)
MCHC: 30.6 g/dL (ref 30.0–36.0)
MCV: 83.3 fL (ref 80.0–100.0)
Platelets: 233 10*3/uL (ref 150–400)
RBC: 3.96 MIL/uL (ref 3.87–5.11)
RDW: 13.4 % (ref 11.5–15.5)
WBC: 12.4 10*3/uL — ABNORMAL HIGH (ref 4.0–10.5)
nRBC: 0 % (ref 0.0–0.2)

## 2024-02-23 LAB — RPR: RPR Ser Ql: NONREACTIVE

## 2024-02-23 MED ORDER — ONDANSETRON HCL 4 MG/2ML IJ SOLN: 4.0000 mg | Freq: Four times a day (QID) | INTRAMUSCULAR | Status: DC | PRN

## 2024-02-23 MED ORDER — LACTATED RINGERS IV SOLN: INTRAVENOUS | Status: DC

## 2024-02-23 MED ORDER — LIDOCAINE HCL (PF) 1 % IJ SOLN: 30.0000 mL | INTRAMUSCULAR | Status: DC | PRN

## 2024-02-23 MED ORDER — OXYCODONE-ACETAMINOPHEN 5-325 MG PO TABS: 2.0000 | ORAL_TABLET | ORAL | Status: DC | PRN

## 2024-02-23 MED ORDER — OXYCODONE-ACETAMINOPHEN 5-325 MG PO TABS: 1.0000 | ORAL_TABLET | ORAL | Status: DC | PRN

## 2024-02-23 MED ORDER — LACTATED RINGERS IV SOLN: 500.0000 mL | INTRAVENOUS | Status: DC | PRN

## 2024-02-23 MED ORDER — ACETAMINOPHEN 325 MG PO TABS: 650.0000 mg | ORAL_TABLET | ORAL | Status: DC | PRN

## 2024-02-23 MED ORDER — SODIUM CHLORIDE 0.9 % IV SOLN
5.0000 10*6.[IU] | Freq: Once | INTRAVENOUS | Status: AC
  Administered 2024-02-23: 5 10*6.[IU] via INTRAVENOUS
  Filled 2024-02-23: qty 5

## 2024-02-23 MED ORDER — OXYTOCIN-SODIUM CHLORIDE 30-0.9 UT/500ML-% IV SOLN
1.0000 m[IU]/min | INTRAVENOUS | Status: DC
  Administered 2024-02-23: 2 m[IU]/min via INTRAVENOUS

## 2024-02-23 MED ORDER — TERBUTALINE SULFATE 1 MG/ML IJ SOLN: 0.2500 mg | Freq: Once | INTRAMUSCULAR | Status: DC | PRN

## 2024-02-23 MED ORDER — SOD CITRATE-CITRIC ACID 500-334 MG/5ML PO SOLN: 30.0000 mL | ORAL | Status: DC | PRN

## 2024-02-23 MED ORDER — OXYTOCIN BOLUS FROM INFUSION
333.0000 mL | Freq: Once | INTRAVENOUS | Status: AC
  Administered 2024-02-24: 333 mL via INTRAVENOUS

## 2024-02-23 MED ORDER — PENICILLIN G POT IN DEXTROSE 60000 UNIT/ML IV SOLN
3.0000 10*6.[IU] | INTRAVENOUS | Status: DC
  Administered 2024-02-23 – 2024-02-24 (×2): 3 10*6.[IU] via INTRAVENOUS
  Filled 2024-02-23 (×2): qty 50

## 2024-02-23 MED ORDER — PRENATAL MULTIVITAMIN CH: 1.0000 | ORAL_TABLET | Freq: Every day | ORAL | Status: DC

## 2024-02-23 MED ORDER — OXYTOCIN-SODIUM CHLORIDE 30-0.9 UT/500ML-% IV SOLN: 2.5000 [IU]/h | INTRAVENOUS | Status: DC

## 2024-02-23 NOTE — OR Nursing (Signed)
 1255 Time out procedure pt name date of birth and procedure with Dr Nolan Battle . For US  guided drainage of fetal cyst

## 2024-02-23 NOTE — Consult Note (Signed)
 MFM Inpatient Consult Note Patient Name: Chloe Chen  Patient MRN:   [id number]  Referring provider: [facility] Reason for Consult: Fetal anomaly, drainage of fetal cyst  HPI: Chloe Chen is a 28 y.o. G2P1001 at [redacted]w[redacted]d admitted after performing a drainage of a fetal abdominal cyst suspected to be of renal origin.  The patient was taken to the PACU where informed consent was obtained through written and verbal methods to perform an ultrasound guidance of a fetal cyst aspiration. I discussed the risks versus the benefits of this procedure.  After consulting with a number of the other physicians including the NICU there seems to be a significant clinical benefit to draining the abdominal cyst to allow for reduction in the intra-abdominal pressure theoretically reducing the risk of stillbirth due to caval compression.  There was also the benefit of reducing the abdominal circumference which had increased to approximately 8 weeks ahead of the patient's gestational age.  The patient strongly desires a vaginal birth and we discussed that this would increase the chance of a successful vaginal birth.  I discussed the main risk of the procedure would be damage to the fetus including hemorrhage and possibly fetal distress resulting in a cesarean delivery or even fetal death.  For this reason this procedure was performed in the hospital with fetal monitoring.  Given the unknown rate of fluid accumulation it was recommended that she undergo attempted delivery today in order to prevent a reaccumulation of fluid especially since she verbalized that she did not want to have the procedure again.  We discussed the newborn risks of a 36-week fetus versus a 39-week fetus and the patient verbalized understanding and desires to proceed with delivery.  Review of Systems: A review of systems was performed and was negative except per HPI   Past Obstetrical History:  OB History  Gravida Para Term Preterm AB Living  2  1 1  0 0 1  SAB IAB Ectopic Multiple Live Births  0 0 0 0 1    # Outcome Date GA Lbr Len/2nd Weight Sex Type Anes PTL Lv  2 Current           1 Term 01/28/22 [redacted]w[redacted]d  3050 g M Vag-Spont   LIV     Past Gynecologic History:  Not discussed  Past Medical History:  Past Medical History:  Diagnosis Date   Medical history non-contributory       Past Surgical History:    Past Surgical History:  Procedure Laterality Date   KNEE SURGERY  02/2019   KNEE SURGERY  06/2019   WISDOM TOOTH EXTRACTION       Family History:   family history is not on file.   Social History:   Social History   Socioeconomic History   Marital status: Single    Spouse name: Not on file   Number of children: Not on file   Years of education: Not on file   Highest education level: Not on file  Occupational History   Occupation: Chef    Employer: [employer]  Tobacco Use   Smoking status: Never   Smokeless tobacco: Never  Vaping Use   Vaping status: Never Used  Substance and Sexual Activity   Alcohol use: Not Currently   Drug use: Never   Sexual activity: Yes    Birth control/protection: None  Other Topics Concern   Not on file  Social History Narrative   Not on file   Social Drivers of Health  Financial Resource Strain: Not on file  Food Insecurity: No Food Insecurity (02/23/2024)   Hunger Vital Sign    Worried About Running Out of Food in the Last Year: Never true    Ran Out of Food in the Last Year: Never true  Transportation Needs: No Transportation Needs (02/23/2024)   PRAPARE - Administrator, Civil Service (Medical): No    Lack of Transportation (Non-Medical): No  Physical Activity: Not on file  Stress: Not on file  Social Connections: Not on file  Intimate Partner Violence: Not At Risk (02/23/2024)   Humiliation, Afraid, Rape, and Kick questionnaire    Fear of Current or Ex-Partner: No    Emotionally Abused: No    Physically Abused: No    Sexually Abused: No       Home Medications:   No current facility-administered medications on file prior to encounter.   Current Outpatient Medications on File Prior to Encounter  Medication Sig Dispense Refill   Prenatal Vit-Fe Fumarate-FA (MULTIVITAMIN-PRENATAL) 27-0.8 MG TABS tablet Take 1 tablet by mouth daily at 12 noon.        Allergies:   No Known Allergies   Physical Exam:   Vitals:   02/23/24 1251 02/23/24 1329  BP: 111/65 125/81  Pulse: (!) 109 85  Resp:    Temp:    SpO2: 97% 99%   Sitting comfortably on the hospital bed  Nonlabored breathing Normal rate and rhythm Abdomen is nontender  Assessment  Chloe Chen is a 28 y.o. G2P1001 at [redacted]w[redacted]d admitted for the following.    Abnormal fetal ultrasound (large renal cyst) - Plan: US  MFM OB FOLLOW UP, US  MFM OB FOLLOW UP, US  MFM FETAL DRAIN W/ US  GUIDANCE, US  MFM FETAL DRAIN W/ US  GUIDANCE, CANCELED: US  MFM AMNIO (THERA) FLUID REDUCTION, CANCELED: US  MFM OB LIMITED, CANCELED: US  MFM AMNIO (THERA) FLUID REDUCTION, CANCELED: US  MFM OB LIMITED  Recommendations - Admit to labor and delivery for induction of labor for fetal anomaly and potential risk of fluid reaccumulation within the fetus. - Postnatal ultrasound of the fetal renal system - NICU aware  Approximately 70 minutes was spent in chart review, communication with other providers, patient assessment and education and documentation. Thank you for the opportunity to be involved with this patient's care. Please let us  know if we can be of any further assistance.   Penney Bowling, DO Maternal fetal medicine, [place]   02/23/2024  3:25 PM

## 2024-02-23 NOTE — Progress Notes (Signed)
 Procedure Note Ultrasound guidance of drainage of fetal cyst  Chloe Chen female 28 y.o. 02/23/2024  Indications: The patient was admitted to the hospital for ultrasound guidance and drainage of a suspected fetal renal cyst.  Due to concern for fluid accumulation delivery was recommended to allow for a vaginal birth and to continue to monitor the fetus after the procedure.  Surgeon: Penney Bowling   Assistants: n/a  Anesthesia: None  Procedure Detail After appropriate consent the patient was taken to the PACU and prepped and draped in the appropriate sterile fashion for an abdominal procedure.  The ultrasound was used to identify the fetal cyst.  A 9 cm long 20-gauge amniocentesis needle was used to insert the needle into the fetal abdomen into the cystic structure taking great care to avoid the liver as well as the fetal blood vessels.  Color Doppler was used to identify all blood vessels.  135 mL of amber fluid was removed from the cystic structure.  The fetus tolerated the procedure well and had a reactive nonstress test before and after the procedure.  Findings: The fetal abdomen contains a large anechoic structure likely resembling a fetal cyst.  This is occupying the location of the right fetal kidney.  This area measures 9 x 6.1 x 1.2 cm and the abdominal circumference measures 38.9 cm.  After the procedure this area collapsed to an area 4.3 x 1.7 x 1 cm with the abdominal circumference measuring 34.6 cm.  After draining approximately 135 mL of amber-colored fluid the area previously occupying the renal cyst appeared to be filled with what now appears to be the remainder of the right kidney.  This kidney has distorted anatomy with a number of cysts of varying size and location.  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         Blood Given: None  Specimens: None    Complications:  None         Disposition: Pt to be admitted for labor.          Condition: stable

## 2024-02-23 NOTE — Progress Notes (Signed)
 Patient ID: Chloe Chen, female   DOB: Jun 24, 1996, 28 y.o.   MRN: [id number] FHR reassuring Vitals:   02/23/24 1930 02/23/24 2058  BP: 109/62 96/63  Pulse: 88 84  Resp:    Temp:    SpO2:     AROM clear fluid Dilation: 4 Effacement (%): 70 Cervical Position: Posterior Station: -3 Presentation: Vertex Exam by:: Broadus Canes CNM

## 2024-02-23 NOTE — Progress Notes (Signed)
 Patient ID: Chloe Chen, female   DOB: 1995-12-11, 28 y.o.   MRN: [id number] Doing well  Vitals:   02/23/24 1730 02/23/24 1800 02/23/24 1837 02/23/24 1930  BP: 106/67 112/63 109/71 109/62  Pulse: 85 97 96 88  Resp:      Temp:      TempSrc:      SpO2:       FHR stable and reassuring + accels Average variability  Dilation: 4 Effacement (%): 70 Station: -2 Presentation: Vertex Exam by:: Dr. Randel Buss  Cervix not rechecked   Continue plan of care

## 2024-02-23 NOTE — H&P (Signed)
 OBSTETRIC ADMISSION HISTORY AND PHYSICAL  Chloe Chen is a 28 y.o. female G2P1001 with IUP at [redacted]w[redacted]d by LMP presenting for IOL due to fetal renal cyst which was drained via fine-needle aspiration today.  Concern for reaccumulation so MFM recommended induction of labor.. She reports +FMs, No LOF, no VB, no blurry vision, headaches or peripheral edema, and RUQ pain.  She plans on breast and formula feeding. She request Nexplanon for birth control. She received her [facility]   Dating: By LMP --->  Estimated Date of Delivery: 03/20/24  Sono:    @[redacted]w[redacted]d , CWD, large renal cyst appreciated, otherwise normal anatomy, cephalic presentation, anterior placenta lie, 3431g, >99% EFW likely due to large AC due to renal cyst   Prenatal History/Complications: Large renal cyst  Past Medical History: Past Medical History:  Diagnosis Date   Medical history non-contributory     Past Surgical History: Past Surgical History:  Procedure Laterality Date   KNEE SURGERY  02/2019   KNEE SURGERY  06/2019   WISDOM TOOTH EXTRACTION      Obstetrical History: OB History     Gravida  2   Para  1   Term  1   Preterm  0   AB  0   Living  1      SAB  0   IAB  0   Ectopic  0   Multiple  0   Live Births  1           Social History Social History   Socioeconomic History   Marital status: Single    Spouse name: Not on file   Number of children: Not on file   Years of education: Not on file   Highest education level: Not on file  Occupational History   Occupation: Chef    Employer: [employer]  Tobacco Use   Smoking status: Never   Smokeless tobacco: Never  Vaping Use   Vaping status: Never Used  Substance and Sexual Activity   Alcohol use: Not Currently   Drug use: Never   Sexual activity: Yes    Birth control/protection: None  Other Topics Concern   Not on file  Social History Narrative   Not on file   Social Drivers of Health   Financial Resource  Strain: Not on file  Food Insecurity: No Food Insecurity (02/23/2024)   Hunger Vital Sign    Worried About Running Out of Food in the Last Year: Never true    Ran Out of Food in the Last Year: Never true  Transportation Needs: No Transportation Needs (02/23/2024)   PRAPARE - Administrator, Civil Service (Medical): No    Lack of Transportation (Non-Medical): No  Physical Activity: Not on file  Stress: Not on file  Social Connections: Not on file    Family History: Family History  Problem Relation Age of Onset   Diabetes Neg Hx    Hypertension Neg Hx    Cancer Neg Hx     Allergies: No Known Allergies  Medications Prior to Admission  Medication Sig Dispense Refill Last Dose/Taking   Prenatal Vit-Fe Fumarate-FA (MULTIVITAMIN-PRENATAL) 27-0.8 MG TABS tablet Take 1 tablet by mouth daily at 12 noon.        Review of Systems   All systems reviewed and negative except as stated in HPI  Blood pressure 125/81, pulse 85, temperature 98.3 F (36.8 C), temperature source Oral, resp. rate 16, last menstrual period 06/14/2023, SpO2 99%, not currently  breastfeeding. General appearance: alert, cooperative, and appears stated age Lungs: clear to auscultation bilaterally Heart: regular rate and rhythm Abdomen: soft, non-tender; bowel sounds normal Extremities: Homans sign is negative, no sign of DVT Presentation: cephalic Fetal monitoringBaseline: 140 bpm, Variability: Good {> 6 bpm), Accelerations: Reactive, and Decelerations: Absent Uterine activityNone Dilation: 4 Effacement (%): 70 Station: -2 Exam by:: Dr. Randel Buss   Prenatal labs: ABO, Rh: --/--/O NEG (06/17 1204) Antibody: POS (06/17 1204) Rubella: 8.53 (01/02 1611) RPR: Non Reactive (04/28 0845)  HBsAg: Negative (01/02 1611)  HIV: Non Reactive (04/28 0845)  GBS:      Lab Results  Component Value Date   GBS Negative 01/16/2022   GTT normal Genetic screening normal Anatomy US  renal cyst  Immunization  History  Administered Date(s) Administered   Tdap 11/08/2021, 01/04/2024    Prenatal Transfer Tool  Maternal Diabetes: No Genetic Screening: Normal Maternal Ultrasounds/Referrals: Other: Fetal Ultrasounds or other Referrals: Renal cyst Maternal Substance Abuse:  No Significant Maternal Medications:  None Significant Maternal Lab Results: Rh negative Number of Prenatal Visits:greater than 3 verified prenatal visits Maternal Vaccinations:RSV: Given during pregnancy >/=14 days ago Other Comments:  None   Results for orders placed or performed during the hospital encounter of 02/23/24 (from the past 24 hours)  Type and screen [facility]   Collection Time: 02/23/24 12:04 PM  Result Value Ref Range   ABO/RH(D) O NEG    Antibody Screen POS    Sample Expiration 02/26/2024,2359    Antibody Identification      PASSIVELY ACQUIRED ANTI-D Performed at [facility], [address], [place], [place] [address]   CBC   Collection Time: 02/23/24 12:05 PM  Result Value Ref Range   WBC 12.4 (H) 4.0 - 10.5 K/uL   RBC 3.96 3.87 - 5.11 MIL/uL   Hemoglobin 10.1 (L) 12.0 - 15.0 g/dL   HCT 69.6 (L) 29.5 - 28.4 %   MCV 83.3 80.0 - 100.0 fL   MCH 25.5 (L) 26.0 - 34.0 pg   MCHC 30.6 30.0 - 36.0 g/dL   RDW 13.2 44.0 - 10.2 %   Platelets 233 150 - 400 K/uL   nRBC 0.0 0.0 - 0.2 %    Patient Active Problem List   Diagnosis Date Noted   Abnormal fetal ultrasound (large renal cyst) 01/14/2024   History of prior pregnancy with IUGR newborn 10/19/2023   Supervision of high risk pregnancy, antepartum 09/10/2023   Hyperemesis gravidarum 08/12/2023   Anxiety 08/26/2022   Rh negative state in antepartum period 10/17/2021   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 G2P1001 at [redacted]w[redacted]d here for IOL for fetal renal cyst s/p drainage.  #Labor: SVE 4/70/-2.  Had not well applied to the cervix.  Will start Pitocin  and AROM when able #Pain: Per  patient request #FWB: Category 1, reassuring #GBS status:  unknown #Feeding: Breastmilk  and Formula #Reproductive Life planning: Nexplanon #Circ:  yes  Ferdie Housekeeper, MD  02/23/2024, 3:40 PM

## 2024-02-24 ENCOUNTER — Inpatient Hospital Stay (HOSPITAL_COMMUNITY): Admitting: Anesthesiology

## 2024-02-24 ENCOUNTER — Encounter (HOSPITAL_COMMUNITY): Payer: Self-pay | Admitting: Family Medicine

## 2024-02-24 DIAGNOSIS — O283 Abnormal ultrasonic finding on antenatal screening of mother: Secondary | ICD-10-CM

## 2024-02-24 DIAGNOSIS — Z3A36 36 weeks gestation of pregnancy: Secondary | ICD-10-CM

## 2024-02-24 MED ORDER — COCONUT OIL OIL
1.0000 | TOPICAL_OIL | Status: DC | PRN
  Administered 2024-02-25: 1 via TOPICAL

## 2024-02-24 MED ORDER — IBUPROFEN 600 MG PO TABS
600.0000 mg | ORAL_TABLET | Freq: Four times a day (QID) | ORAL | Status: DC
Start: 2024-02-24 — End: 2024-02-26
  Administered 2024-02-24 – 2024-02-26 (×6): 600 mg via ORAL
  Filled 2024-02-24 (×9): qty 1

## 2024-02-24 MED ORDER — DIPHENHYDRAMINE HCL 50 MG/ML IJ SOLN: 12.5000 mg | INTRAMUSCULAR | Status: DC | PRN

## 2024-02-24 MED ORDER — PHENYLEPHRINE 80 MCG/ML (10ML) SYRINGE FOR IV PUSH (FOR BLOOD PRESSURE SUPPORT): 80.0000 ug | PREFILLED_SYRINGE | INTRAVENOUS | Status: DC | PRN

## 2024-02-24 MED ORDER — ONDANSETRON HCL 4 MG/2ML IJ SOLN: 4.0000 mg | INTRAMUSCULAR | Status: DC | PRN

## 2024-02-24 MED ORDER — OXYCODONE HCL 5 MG PO TABS: 10.0000 mg | ORAL_TABLET | ORAL | Status: DC | PRN

## 2024-02-24 MED ORDER — SENNOSIDES-DOCUSATE SODIUM 8.6-50 MG PO TABS
2.0000 | ORAL_TABLET | Freq: Every day | ORAL | Status: DC
  Administered 2024-02-25: 2 via ORAL
  Filled 2024-02-24 (×2): qty 2

## 2024-02-24 MED ORDER — ZOLPIDEM TARTRATE 5 MG PO TABS: 5.0000 mg | ORAL_TABLET | Freq: Every evening | ORAL | Status: DC | PRN

## 2024-02-24 MED ORDER — FENTANYL-BUPIVACAINE-NACL 0.5-0.125-0.9 MG/250ML-% EP SOLN
12.0000 mL/h | EPIDURAL | Status: DC | PRN
  Administered 2024-02-24: 12 mL/h via EPIDURAL
  Filled 2024-02-24: qty 250

## 2024-02-24 MED ORDER — PRENATAL MULTIVITAMIN CH
1.0000 | ORAL_TABLET | Freq: Every day | ORAL | Status: DC
Start: 2024-02-24 — End: 2024-02-26
  Administered 2024-02-24 – 2024-02-25 (×2): 1 via ORAL
  Filled 2024-02-24 (×3): qty 1

## 2024-02-24 MED ORDER — ACETAMINOPHEN 325 MG PO TABS
650.0000 mg | ORAL_TABLET | ORAL | Status: DC | PRN
Start: 2024-02-24 — End: 2024-02-26

## 2024-02-24 MED ORDER — OXYCODONE HCL 5 MG PO TABS: 5.0000 mg | ORAL_TABLET | ORAL | Status: DC | PRN

## 2024-02-24 MED ORDER — ONDANSETRON HCL 4 MG PO TABS: 4.0000 mg | ORAL_TABLET | ORAL | Status: DC | PRN

## 2024-02-24 MED ORDER — BENZOCAINE-MENTHOL 20-0.5 % EX AERO: 1.0000 | INHALATION_SPRAY | CUTANEOUS | Status: DC | PRN

## 2024-02-24 MED ORDER — EPHEDRINE 5 MG/ML INJ: 10.0000 mg | INTRAVENOUS | Status: DC | PRN

## 2024-02-24 MED ORDER — DIPHENHYDRAMINE HCL 25 MG PO CAPS: 25.0000 mg | ORAL_CAPSULE | Freq: Four times a day (QID) | ORAL | Status: DC | PRN

## 2024-02-24 MED ORDER — LIDOCAINE HCL (PF) 1 % IJ SOLN
INTRAMUSCULAR | Status: DC | PRN
  Administered 2024-02-24: 8 mL via EPIDURAL

## 2024-02-24 MED ORDER — WITCH HAZEL-GLYCERIN EX PADS: 1.0000 | MEDICATED_PAD | CUTANEOUS | Status: DC | PRN

## 2024-02-24 MED ORDER — DIBUCAINE (PERIANAL) 1 % EX OINT: 1.0000 | TOPICAL_OINTMENT | CUTANEOUS | Status: DC | PRN

## 2024-02-24 MED ORDER — LACTATED RINGERS IV SOLN: 500.0000 mL | Freq: Once | INTRAVENOUS | Status: DC

## 2024-02-24 MED ORDER — EPHEDRINE 5 MG/ML INJ
10.0000 mg | INTRAVENOUS | Status: DC | PRN
Start: 2024-02-24 — End: 2024-02-24

## 2024-02-24 MED ORDER — SIMETHICONE 80 MG PO CHEW: 80.0000 mg | CHEWABLE_TABLET | ORAL | Status: DC | PRN

## 2024-02-24 NOTE — Anesthesia Preprocedure Evaluation (Signed)
 Anesthesia Evaluation  Patient identified by MRN, date of birth, ID band Patient awake    Reviewed: Allergy & Precautions, H&P , NPO status , Patient's Chart, lab work & pertinent test results, reviewed documented beta blocker date and time   Airway Mallampati: II  TM Distance: >3 FB Neck ROM: full    Dental no notable dental hx.    Pulmonary neg pulmonary ROS   Pulmonary exam normal breath sounds clear to auscultation       Cardiovascular negative cardio ROS Normal cardiovascular exam Rhythm:regular Rate:Normal     Neuro/Psych   Anxiety     negative neurological ROS  negative psych ROS   GI/Hepatic negative GI ROS, Neg liver ROS,,,  Endo/Other  negative endocrine ROS    Renal/GU negative Renal ROS  negative genitourinary   Musculoskeletal   Abdominal   Peds  Hematology negative hematology ROS (+)   Anesthesia Other Findings   Reproductive/Obstetrics (+) Pregnancy                             Anesthesia Physical Anesthesia Plan  ASA: 2  Anesthesia Plan: Epidural   Post-op Pain Management: Minimal or no pain anticipated   Induction:   PONV Risk Score and Plan: 2  Airway Management Planned: Natural Airway  Additional Equipment: None  Intra-op Plan:   Post-operative Plan:   Informed Consent: I have reviewed the patients History and Physical, chart, labs and discussed the procedure including the risks, benefits and alternatives for the proposed anesthesia with the patient or authorized representative who has indicated his/her understanding and acceptance.       Plan Discussed with: Anesthesiologist  Anesthesia Plan Comments:        Anesthesia Quick Evaluation

## 2024-02-24 NOTE — Discharge Summary (Shared)
 Postpartum Discharge Summary  Date of Service updated***     Patient Name: Chloe Chen DOB: [birth year] MRN: [id number]  Date of admission: 02/23/2024 Delivery date:02/24/2024 Delivering provider: Harlee Lichtenstein Date of discharge: 02/25/2024  Admitting diagnosis: Indication for care in labor or delivery [O75.9] Intrauterine pregnancy: 104w3d     Secondary diagnosis:  Principal Problem:   SVD (spontaneous vaginal delivery) Active Problems:   Rh negative state in antepartum period   Supervision of high risk pregnancy, antepartum   Abnormal fetal ultrasound (large renal cyst)  Additional problems: Fetal Renal Cyst (s/p drainage)    Discharge diagnosis: Term Pregnancy Delivered and Fetal Renal Cyst                                              Post partum procedures:Nexplanon insertion Augmentation: AROM and Pitocin  Complications: None  Hospital course: Induction of Labor With Vaginal Delivery   28 y.o. yo [id number] at [redacted]w[redacted]d was admitted to the hospital 02/23/2024 for induction of labor.  Indication for induction: Fetal Renal Cyst.  Patient had an labor course complicated by nothing Membrane Rupture Time/Date: 10:20 PM,02/23/2024  Delivery Method:Vaginal, Spontaneous Operative Delivery:N/A Episiotomy: None Lacerations:  None Details of delivery can be found in separate delivery note.  Patient had a postpartum course complicated by none. Patient is discharged home 02/25/24.  Newborn Data: Birth date:02/24/2024 Birth time:5:15 AM Gender:Female Living status:Living Apgars:8 ,9  Weight:3390 g  Magnesium Sulfate received: No BMZ received: No Rhophylac :Yes MMR:No T-DaP:Given prenatally Flu: No RSV Vaccine received: No Transfusion:No Immunizations administered: Immunization History  Administered Date(s) Administered   Tdap 11/08/2021, 01/04/2024    Physical exam  Vitals:   02/24/24 1340 02/24/24 1643 02/24/24 2140 02/25/24 0600  BP: 112/62 114/67 109/67 113/69  Pulse:  77 76 65 (!) 59  Resp:  18 17 17   Temp:  98 F (36.7 C) (!) 97.5 F (36.4 C) 97.8 F (36.6 C)  TempSrc:  Oral Oral Oral  SpO2:  100% 99% 99%   General: alert, cooperative, and no distress Lochia: appropriate Uterine Fundus: firm Incision: N/A DVT Evaluation: No evidence of DVT seen on physical exam. Labs: Lab Results  Component Value Date   WBC 12.4 (H) 02/23/2024   HGB 10.1 (L) 02/23/2024   HCT 33.0 (L) 02/23/2024   MCV 83.3 02/23/2024   PLT 233 02/23/2024      Latest Ref Rng & Units 08/11/2023   11:17 AM  CMP  Glucose 70 - 99 mg/dL 147   BUN 6 - 20 mg/dL 10   Creatinine 8.29 - 1.00 mg/dL 5.62   Sodium 130 - 865 mmol/L 132   Potassium 3.5 - 5.1 mmol/L 3.5   Chloride 98 - 111 mmol/L 100   CO2 22 - 32 mmol/L 23   Calcium 8.9 - 10.3 mg/dL 9.3   Total Protein 6.5 - 8.1 g/dL 8.4   Total Bilirubin <7.8 mg/dL 0.8   Alkaline Phos 38 - 126 U/L 48   AST 15 - 41 U/L 34   ALT 0 - 44 U/L 20    Edinburgh Score:    02/24/2024    8:40 AM  Edinburgh Postnatal Depression Scale Screening Tool  I have been able to laugh and see the funny side of things. 0  I have looked forward with enjoyment to things. 0  I have blamed myself unnecessarily when things  went wrong. 1  I have been anxious or worried for no good reason. 1  I have felt scared or panicky for no good reason. 0  Things have been getting on top of me. 0  I have been so unhappy that I have had difficulty sleeping. 0  I have felt sad or miserable. 0  I have been so unhappy that I have been crying. 0  The thought of harming myself has occurred to me. 0  Edinburgh Postnatal Depression Scale Total 2      After visit meds:  Allergies as of 02/25/2024   No Known Allergies      Medication List     TAKE these medications    multivitamin-prenatal 27-0.8 MG Tabs tablet Take 1 tablet by mouth daily at 12 noon.         Discharge home in stable condition Infant Feeding: Breast Infant Disposition:home with  mother Discharge instruction: per After Visit Summary and Postpartum booklet. Activity: Advance as tolerated. Pelvic rest for 6 weeks.  Diet: routine diet Anticipated Birth Control: Nexplanon Additional Postpartum F/U: None  Future Appointments: Future Appointments  Date Time Provider Department Center  03/31/2024  9:35 AM Teena Feast, MD [facility]    02/25/2024 Maud Sorenson, MD

## 2024-02-24 NOTE — Anesthesia Postprocedure Evaluation (Addendum)
 Anesthesia Post Note  Patient: Yoltzin Ransom  Procedure(s) Performed: AN AD HOC LABOR EPIDURAL     Patient location during evaluation: Mother Baby Anesthesia Type: Epidural Level of consciousness: awake and alert, oriented and awake Pain management: pain level controlled Vital Signs Assessment: post-procedure vital signs reviewed and stable Respiratory status: spontaneous breathing, nonlabored ventilation and respiratory function stable Cardiovascular status: stable Postop Assessment: no headache, no backache, patient able to bend at knees, adequate PO intake, no apparent nausea or vomiting and able to ambulate Anesthetic complications: no   No notable events documented.  Last Vitals:  Vitals:   02/24/24 0840 02/24/24 0940  BP: 122/67 104/61  Pulse: 70 72  Resp: 18   Temp: 36.7 C   SpO2: 100%     Last Pain:  Vitals:   02/24/24 1340  TempSrc:   PainSc: 0-No pain   Pain Goal:                Epidural/Spinal Function Cutaneous sensation: Normal sensation (02/24/24 1340), Patient able to flex knees: Yes (02/24/24 1340), Patient able to lift hips off bed: Yes (02/24/24 1340), Back pain beyond tenderness at insertion site: No (02/24/24 1340), Progressively worsening motor and/or sensory loss: No (02/24/24 1340), Bowel and/or bladder incontinence post epidural: No (02/24/24 1340)  Azzie Leriche

## 2024-02-24 NOTE — Progress Notes (Signed)
 Patient ID: Shaquel Josephson, female   DOB: [birth year], 28 y.o.   MRN: [id number] Progressed to complete dilation and has been laboring down.  FHR reassuring  Vitals:   02/24/24 0202 02/24/24 0211  BP: 93/62 (!) 99/56  Pulse: 88 75  Resp:    Temp:    SpO2: 99% 99%

## 2024-02-24 NOTE — Anesthesia Procedure Notes (Signed)
 Epidural Patient location during procedure: OB Start time: 02/24/2024 1:17 AM End time: 02/24/2024 1:22 AM  Staffing Anesthesiologist: Rhenda Cedars, MD Performed: other anesthesia staff   Preanesthetic Checklist Completed: patient identified, IV checked, site marked, risks and benefits discussed, surgical consent, monitors and equipment checked, pre-op evaluation and timeout performed  Epidural Patient position: sitting Prep: DuraPrep and site prepped and draped Patient monitoring: continuous pulse ox and blood pressure Approach: midline Location: L4-L5 Injection technique: LOR air  Needle:  Needle type: Tuohy  Needle gauge: 17 G Needle length: 9 cm and 9 Needle insertion depth: 8 cm Catheter type: closed end flexible Catheter size: 19 Gauge Catheter at skin depth: 14 cm Test dose: negative  Assessment Events: blood not aspirated, no cerebrospinal fluid, injection not painful, no injection resistance, no paresthesia and negative IV test

## 2024-02-24 NOTE — Lactation Note (Signed)
 This note was copied from a baby's chart. Lactation Consultation Note  Patient Name: Chloe Chen ZOXWR'U Date: 02/24/2024 Age:29 hours Reason for consult: Initial assessment;Late-preterm 34-36.6wks  P2- Infant was born at [redacted]w[redacted]d GA weighing [phone]. MOB reports that infant latches well and nurses even better. MOB reports feeling relieved because her first child had much difficulty with latching. LC encouraged using the manual pump or DEBP to offer supplementation, but MOB politely declines at this time. MOB requested colostrum collectors to manually express into instead. LC provided these to MOB. LC was unable to assess a latch due to infant previously feeding and MOB having visitors. LC encouraged MOB to call for a latch assessment. [name] inquired about when they should start introducing a bottle to get infant used to them for when MOB returns to work. [name] states that they plan to focus on excelling at breastfeeding first. LC reviewed how the [facility] may be beneficial in collecting mature milk until week 3-4 when MOB is ready to start pumping. LC reviewed how they could start introducing one bottle a day for a week, then increase it by one every week until MOB goes back to work. LC reviewed the importance of pumping during bottle feeding to still offer breast stimulation as needed.  LC reviewed the first 24 hr birthday nap, day 2 cluster feeding, feeding infant on cue 8-12x in 24 hrs, not allowing infant to go over 3 hrs without a feeding, CDC milk storage guidelines, LC services handout and engorgement/breast care. LC encouraged MOB to call for further assistance as needed.  Maternal Data Has patient been taught Hand Expression?: Yes Does the patient have breastfeeding experience prior to this delivery?: Yes How long did the patient breastfeed?: 3-4 months with first child. Reports that first child had difficulty with latching, so this negatively affected her supply.  Feeding Mother's Current  Feeding Choice: Breast Milk  Lactation Tools Discussed/Used Pump Education: Milk Storage  Interventions Interventions: Breast feeding basics reviewed;Hand pump;DEBP;Education;LC Services brochure;LPT handout/interventions  Discharge Discharge Education: Engorgement and breast care;Warning signs for feeding baby Pump: DEBP;Manual;Hands Free;Personal  Consult Status Consult Status: Follow-up Date: 02/25/24 Follow-up type: In-patient    Vernette Goo BS, IBCLC 02/24/2024, 4:06 PM

## 2024-02-25 ENCOUNTER — Ambulatory Visit

## 2024-02-25 ENCOUNTER — Encounter: Admitting: Family Medicine

## 2024-02-25 DIAGNOSIS — O0990 Supervision of high risk pregnancy, unspecified, unspecified trimester: Secondary | ICD-10-CM

## 2024-02-25 DIAGNOSIS — Z30017 Encounter for initial prescription of implantable subdermal contraceptive: Secondary | ICD-10-CM

## 2024-02-25 MED ORDER — LIDOCAINE HCL 1 % IJ SOLN
0.0000 mL | Freq: Once | INTRAMUSCULAR | Status: AC | PRN
  Administered 2024-02-25: 2 mL via INTRADERMAL
  Filled 2024-02-25: qty 20

## 2024-02-25 MED ORDER — ETONOGESTREL 68 MG ~~LOC~~ IMPL
68.0000 mg | DRUG_IMPLANT | Freq: Once | SUBCUTANEOUS | Status: AC
  Administered 2024-02-25: 68 mg via SUBCUTANEOUS
  Filled 2024-02-25: qty 1

## 2024-02-25 MED ORDER — RHO D IMMUNE GLOBULIN 1500 UNIT/2ML IJ SOSY
300.0000 ug | PREFILLED_SYRINGE | Freq: Once | INTRAMUSCULAR | Status: AC
  Administered 2024-02-25: 300 ug via INTRAVENOUS
  Filled 2024-02-25: qty 2

## 2024-02-25 NOTE — Social Work (Signed)
 CSW received consult for hx of Postpartum Anxiety.  CSW met with MOB to offer support and complete assessment. CSW entered the room and observed MOB sitting at bedside and infant in the bassinet. CSW introduced self, CSW role and reason for visit, MOB was agreeable to visit and was please and engaged throughout the assessment. CSW inquired about how MOB was feeling, MOB reported feeling good. 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 MOB reported she experienced PPA for a bout 6 months and was prescribed medication as needed but rarely took the medication. MOB denied any other MH concerns. CSW inquired about MOB mood currently, MOB reported she feels more confident this time around and knows what to look for in her mood. MOB reported a stable mood currently. CSW provided education regarding the baby blues period vs. perinatal mood disorders, discussed treatment and gave resources for mental health follow up if concerns arise.  CSW recommends self-evaluation during the postpartum time period using the New Mom Checklist from Postpartum Progress and encouraged MOB to contact a medical professional if symptoms are noted at any time.  MOB identified [name] as her primary support.   CSW provided review of Sudden Infant Death Syndrome (SIDS) precautions. MOB reported she has all necessary items for the infant including a bassinet, crib and car seat. CSW identifies no further need for intervention and no barriers to discharge at this time.  Chloe Chen, LCSWA Clinical Social Worker [phone]

## 2024-02-25 NOTE — Procedures (Signed)
 Chloe Chen is a 28 y.o. [id number] scheduled for inpatient postpartum nexplanon insertion.     Nexplanon was placed by Dr. Carey Chapman [facility] resident  Nexplanon Insertion Procedure Patient identified, informed consent performed, consent signed.   Patient does understand that irregular bleeding is a very common side effect of this medication. Appropriate time out taken.  Patient's left arm was prepped and draped in the usual sterile fashion.. The ruler used to measure and mark insertion area.  Patient was prepped with alcohol swab and then injected with 3 ml of 1% lidocaine .  She was prepped with betadine, Nexplanon removed from packaging,  Device confirmed in needle, then inserted full length of needle and withdrawn per handbook instructions. After removal of the needle about 3mm of the nexplanon was above the skin. Sterile procedure was used to advance the the device and this was performed successfully. The distal end of the device was under the subcutaneous tissue. Nexplanon was able to palpated in the patient's arm proximally; patient palpated the insert herself. There was minimal blood loss.  Patient insertion site covered with guaze and a pressure bandage to reduce any bruising.  The patient tolerated the procedure well and was given post procedure instructions.   Abner Ables, MD [facility] [employer], [facility]

## 2024-02-25 NOTE — Lactation Note (Addendum)
 This note was copied from a baby's chart. Lactation Consultation Note  Patient Name: Chloe Chen XBMWU'X Date: 02/25/2024 Age:29 hours Reason for consult: Follow-up assessment;Late-preterm 34-36.6wks  P2, 110w3d.  Baby recently circumcised and breastfed for 15 min. Mother states her nipples are tender.  Provided shells and coconut oil. Attempted latching but baby is sleepy and fell asleep after latching. Discussed warning signs if he repeatedly misses feedings or is sleepy at the breast.   Encouraged hand expressing and giving volume back to baby.   Mother states she has been hand expressing good flow.  Discussed cluster feeding and fussiness after circ.    Maternal Data Has patient been taught Hand Expression?: Yes  Feeding Mother's Current Feeding Choice: Breast Milk  Lactation Tools Discussed/Used Tools: Coconut oil;Shells  Interventions Interventions: Education  Discharge Pump: DEBP  Consult Status Consult Status: Follow-up Date: 02/26/24 Follow-up type: In-patient   Vicenta Graft [facility] 02/25/2024, 3:28 PM

## 2024-02-25 NOTE — Progress Notes (Signed)
 POSTPARTUM PROGRESS NOTE  Subjective: Chloe Chen is a 28 y.o. [id number] s/p SVD at [redacted]w[redacted]d.  She reports she is doing well. No acute events overnight. She denies any problems with ambulating, voiding or po intake. Denies nausea or vomiting. She has passed flatus. Pain is well controlled.  Lochia is light.  Objective: Blood pressure 113/69, pulse (!) 59, temperature 97.8 F (36.6 C), temperature source Oral, resp. rate 17, last menstrual period 06/14/2023, SpO2 99%, unknown if currently breastfeeding.  Physical Exam:  General: alert, cooperative and no distress Chest: no respiratory distress Abdomen: soft, non-tender  Uterine Fundus: firm and at level of umbilicus Extremities: No calf swelling or tenderness  No edema  Recent Labs    02/23/24 1205  HGB 10.1*  HCT 33.0*    Assessment/Plan: Chloe Chen is a 28 y.o. [id number] s/p SVD at [redacted]w[redacted]d.  Routine Postpartum Care: Doing well, pain well-controlled.  -- Continue routine care, lactation support  -- Contraception: Nexplanon -- Feeding: breast  Dispo: Plan for discharge 6/19-6/20.  Maud Sorenson, MD OB Fellow 02/25/2024 8:43 AM

## 2024-02-26 LAB — RH IG WORKUP (INCLUDES ABO/RH)
Fetal Screen: NEGATIVE
Gestational Age(Wks): 36.3
Unit division: 0

## 2024-02-26 LAB — SURGICAL PATHOLOGY

## 2024-02-26 NOTE — Lactation Note (Signed)
 This note was copied from a baby's chart. Lactation Consultation Note  Patient Name: Chloe Chen ZOXWR'U Date: 02/26/2024 Age:29 hours Reason for consult: Follow-up assessment;Late-preterm 34-36.6wks;Infant weight loss;Breastfeeding assistance;MD order  P2- Infant was born at [redacted]w[redacted]d weighing [phone] and has had a total weight loss of 10.77%. MD requested for [facility] to see this family to create a feeding plan to help infant succeed. LC reviewed the weight loss with MOB/[name] and encouraged them to follow a feeding plan. MOB/[name] agreed and thanked LC. LC noted that MOB's milk is now transitioning in and she is a little engorged. LC reviewed the engorgement protocol with MOB and encouraged her to follow this protocol until her engorgement stops.  Feeding plan as follows: -Entire feeding to last 30 minutes or less. (15 minutes max on breast and followed with 15 minutes max on the bottle) -Once the 15 minutes of breastfeeding is done, MOB hands infant to [name] for bottle feeding while MOB pumps for 15 minutes (or more as needed). -[name] must bottle feed with the bottle provided by the SLP. -Supplementation for day 2 is a minimum of 21 mL, day 3 is 28 mL, day 4 is 35 mL and day 5 is 40 mL. This is per feeding. Infant may have more if he wants. -MOB should ice her breasts for 15 minutes after every pumping session to reduce inflammation.  MOB/[name] agreed to feeding plan. LC set up the hospital DEBP with size 21 mm flange for right nipple and 18 mm flange for left nipple. LC provided MOB with ice packs to use after pumping. LC watched MOB pump for 8 minutes and noted a total of 50 mL. MOB was still pumping after LC left room. LC reviewed the LPI feeding plan, CDC milk storage guidelines and LC services handout. MOB reports that she has a follow up with the outpatient [facility] team on Monday June 23. LC encouraged MOB to call for further assistance as needed.  Maternal Data Has patient been taught Hand Expression?:  Yes Does the patient have breastfeeding experience prior to this delivery?: Yes How long did the patient breastfeed?: 3 ish months  Feeding Mother's Current Feeding Choice: Breast Milk and Formula  Lactation Tools Discussed/Used Tools: Pump;Flanges Flange Size: 18;21 (18 for left nipple and 21 for right nipple) Breast pump type: Double-Electric Breast Pump;Manual Pump Education: Setup, frequency, and cleaning;Milk Storage Reason for Pumping: LPI/low birth weight and infant weight loss Pumping frequency: 15-20 min every 3 hrs  Interventions Interventions: Breast feeding basics reviewed;Expressed milk;Hand pump;DEBP;Education;Pace feeding;LC Services brochure;LPT handout/interventions  Discharge Discharge Education: Engorgement and breast care;Warning signs for feeding baby;Outpatient recommendation Pump: DEBP;Manual;Personal  Consult Status Consult Status: Follow-up Date: 02/27/24 Follow-up type: In-patient    Vernette Goo BS, IBCLC 02/26/2024, 5:50 PM

## 2024-02-27 NOTE — Lactation Note (Addendum)
 This note was copied from a baby's chart. Lactation Consultation Note  Patient Name: Chloe Chen Unijb'd Date: 02/27/2024 Age:29 hours Reason for consult: Follow-up assessment;Late-preterm 34-36.6wks;Infant weight loss  P2, Baby [redacted]w[redacted]d PMA.  Weight loss 12% < 1% weight loss in the last 24 hours.  Weight stabilizing.  8 voids/6 stools in the last 24 hours. Father giving formula with Dr. Orlinda Galli.  Mother finished pumping when LC entered room.  She pumped 35 ml.  Praised parents for their efforts. Baby recently breastfed for 15 min and consumed 35 ml of 22 Kcal formula. Discussed engorgement care, milk storage, waking for feeds, and pumping to supplement.    Maternal Data Does the patient have breastfeeding experience prior to this delivery?: Yes  Feeding Mother's Current Feeding Choice: Breast Milk and Formula  Lactation Tools Discussed/Used Tools: Pump;Flanges Pumping frequency: q 3 hours for 15 min Pumped volume: 35 mL  Interventions Interventions: Education  Discharge Discharge Education: Engorgement and breast care;Warning signs for feeding baby  Consult Status Consult Status: Complete Date: 02/27/24   Shannon Levorn Lemme  RN, IBCLC 02/27/2024, 10:05 AM

## 2024-02-29 ENCOUNTER — Encounter: Payer: Self-pay | Admitting: Family Medicine

## 2024-03-02 ENCOUNTER — Encounter: Admitting: Family Medicine

## 2024-03-03 ENCOUNTER — Ambulatory Visit

## 2024-03-04 ENCOUNTER — Telehealth (HOSPITAL_COMMUNITY): Payer: Self-pay | Admitting: *Deleted

## 2024-03-04 NOTE — Telephone Encounter (Signed)
 03/04/2024  Name: Ciria Bernardini MRN: [id number] DOB: 1995/12/23  Reason for Call:  Transition of Care Hospital Discharge Call  Contact Status: Patient Contact Status: Message  Language assistant needed:          Follow-Up Questions:    Van Postnatal Depression Scale:  In the Past 7 Days:    PHQ2-9 Depression Scale:     Discharge Follow-up:    Post-discharge interventions: NA  Mliss Sieve, RN 03/04/2024 11:12

## 2024-03-08 ENCOUNTER — Encounter: Admitting: Family Medicine

## 2024-03-14 ENCOUNTER — Ambulatory Visit

## 2024-03-16 ENCOUNTER — Encounter: Payer: Self-pay | Admitting: Family Medicine

## 2024-03-16 ENCOUNTER — Encounter: Admitting: Family Medicine

## 2024-03-23 ENCOUNTER — Encounter: Admitting: Family Medicine

## 2024-03-23 ENCOUNTER — Other Ambulatory Visit

## 2024-03-31 ENCOUNTER — Ambulatory Visit: Admitting: Family Medicine

## 2024-03-31 ENCOUNTER — Encounter: Payer: Self-pay | Admitting: Family Medicine

## 2024-03-31 ENCOUNTER — Other Ambulatory Visit: Payer: Self-pay

## 2024-03-31 DIAGNOSIS — Z6791 Unspecified blood type, Rh negative: Secondary | ICD-10-CM

## 2024-03-31 NOTE — Progress Notes (Signed)
 Post Partum Visit Note  Chloe Chen is a 28 y.o. G36P1102 female who presents for a postpartum visit. She is 5 weeks postpartum following a normal spontaneous vaginal delivery.  I have fully reviewed the prenatal and intrapartum course. The delivery was at 36 gestational weeks.  Anesthesia: epidural. Postpartum course has been notable for lots of visits with baby to specialists. 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 Baby is feeding by breast. Bleeding no bleeding. Bowel function is normal. Bladder function is normal. Patient is not sexually active. Contraception method is Nexplanon . Postpartum depression screening: negative.   The pregnancy intention screening data noted above was reviewed. Potential methods of contraception were discussed. The patient elected to proceed with No data recorded.   Edinburgh Postnatal Depression Scale - 03/31/24 1010       Edinburgh Postnatal Depression Scale:  In the Past 7 Days   I have been able to laugh and see the funny side of things. 0    I have looked forward with enjoyment to things. 0    I have blamed myself unnecessarily when things went wrong. 0    I have been anxious or worried for no good reason. 0    I have felt scared or panicky for no good reason. 0    Things have been getting on top of me. 0    I have been so unhappy that I have had difficulty sleeping. 0    I have felt sad or miserable. 0    I have been so unhappy that I have been crying. 0    The thought of harming myself has occurred to me. 0    Edinburgh Postnatal Depression Scale Total 0          Health Maintenance Due  Topic Date Due   Hepatitis B Vaccines (1 of 3 - 19+ 3-dose series) Never done   HPV VACCINES (1 - 3-dose SCDM series) Never done   COVID-19 Vaccine (1 - 2024-25 season) Never done    The following portions of the patient's history were reviewed and updated as appropriate: allergies,  current medications, past family history, past medical history, past social history, past surgical history, and problem list.  Review of Systems Pertinent items noted in HPI and remainder of comprehensive ROS otherwise negative.  Objective:  BP 121/69   Pulse 69   Wt 178 lb 3.2 oz (80.8 kg)   LMP 06/14/2023 (Exact Date)   BMI 31.57 kg/m    General:  alert, cooperative, and appears stated age   Breasts:  not indicated  Lungs: Comfortalbe on room air  Wound N/a  GU exam:  not indicated        Assessment:   No diagnosis found.  Normal postpartum exam.   Plan:   Essential components of care per ACOG recommendations:  1.  Mood and well being: Patient with negative depression screening today. Reviewed local resources for support.  - Patient tobacco use? No.   - hx of drug use? No.    2. Infant care and feeding:  -Patient currently breastmilk feeding? Yes. Reviewed importance of draining breast regularly to support lactation.  -Social determinants of health (SDOH) reviewed in EPIC. No concerns  3. Sexuality, contraception and birth spacing - Patient does not want a pregnancy in the next year.  Desired family size is 2-3 children.  - Reviewed reproductive life planning. Reviewed contraceptive methods based on pt preferences and  effectiveness.  Patient had nexplanon  placed prior to discharge.   - Discussed birth spacing of 18 months  4. Sleep and fatigue -Encouraged family/partner/community support of 4 hrs of uninterrupted sleep to help with mood and fatigue  5. Physical Recovery  - Discussed patients delivery and complications. She describes her labor as good. - Patient had a Vaginal, no problems at delivery. Patient had no laceration. Perineal healing reviewed. Patient expressed understanding - Patient has urinary incontinence? No. - Patient is safe to resume physical and sexual activity  6.  Health Maintenance - HM due items addressed No - up to date - Last pap smear   Diagnosis  Date Value Ref Range Status  09/10/2023   Final   - Negative for intraepithelial lesion or malignancy (NILM)   Pap smear not done at today's visit.  -Breast Cancer screening indicated? No.   7. Chronic Disease/Pregnancy Condition follow up: None 1. Postpartum care and examination See above  2. Rh negative state in antepartum period Received rhogam prior to discharge   - PCP follow up   Donnice CHRISTELLA Carolus, MD/MPH Attending Family Medicine Physician, [facility] for [facility], [facility]

## 2024-03-31 NOTE — Patient Instructions (Signed)
    We are very excited to take care of your family. If you have any urgent concerns after business hours or on the weekends please call  [phone]  This will connect you to our after hours nurse-line and they will be able to contact the doctor on call for any questions. One of our family physicians is on-call at all times to assure your children receive the best care and to help you with any urgent medical needs. If you are considering going to an urgent care or emergency department please call us first to discuss.  If you have a non-urgent concern please send a MyChart message. We routinely answer these during regular business hours.

## 2024-05-17 ENCOUNTER — Encounter: Payer: Self-pay | Admitting: Family Medicine
